# Patient Record
Sex: Female | Born: 1935
Health system: Southern US, Community
[De-identification: ages and names within clinical notes are randomized; demographics above are authoritative.]

## PROBLEM LIST (undated history)

## (undated) DIAGNOSIS — I10 Essential (primary) hypertension: Secondary | ICD-10-CM

## (undated) DIAGNOSIS — C801 Malignant (primary) neoplasm, unspecified: Secondary | ICD-10-CM

## (undated) DIAGNOSIS — F419 Anxiety disorder, unspecified: Secondary | ICD-10-CM

## (undated) DIAGNOSIS — H269 Unspecified cataract: Secondary | ICD-10-CM

## (undated) DIAGNOSIS — M810 Age-related osteoporosis without current pathological fracture: Secondary | ICD-10-CM

## (undated) DIAGNOSIS — T7840XA Allergy, unspecified, initial encounter: Secondary | ICD-10-CM

## (undated) DIAGNOSIS — E079 Disorder of thyroid, unspecified: Secondary | ICD-10-CM

## (undated) HISTORY — DX: Unspecified cataract: H26.9

## (undated) HISTORY — DX: Disorder of thyroid, unspecified: E07.9

## (undated) HISTORY — PX: TONSILLECTOMY: SUR1361

## (undated) HISTORY — PX: SPINE SURGERY: SHX786

## (undated) HISTORY — PX: EYE MUSCLE SURGERY: SHX370

## (undated) HISTORY — DX: Malignant (primary) neoplasm, unspecified: C80.1

## (undated) HISTORY — PX: TUBAL LIGATION: SHX77

## (undated) HISTORY — PX: BACK SURGERY: SHX140

## (undated) HISTORY — DX: Allergy, unspecified, initial encounter: T78.40XA

---

## 2005-01-13 ENCOUNTER — Encounter: Payer: Self-pay | Admitting: Family Medicine

## 2006-12-06 ENCOUNTER — Encounter: Payer: Self-pay | Admitting: Family Medicine

## 2012-02-14 DIAGNOSIS — E042 Nontoxic multinodular goiter: Secondary | ICD-10-CM | POA: Insufficient documentation

## 2014-04-17 ENCOUNTER — Ambulatory Visit: Payer: Self-pay | Admitting: Family Medicine

## 2014-04-21 ENCOUNTER — Other Ambulatory Visit (HOSPITAL_COMMUNITY): Payer: Self-pay | Admitting: Family Medicine

## 2014-04-21 DIAGNOSIS — T148XXA Other injury of unspecified body region, initial encounter: Secondary | ICD-10-CM

## 2014-04-22 ENCOUNTER — Ambulatory Visit (HOSPITAL_COMMUNITY)
Admission: RE | Admit: 2014-04-22 | Discharge: 2014-04-22 | Disposition: A | Discharge status: Home / Self Care | Payer: Medicare HMO | Source: Ambulatory Visit | Attending: Family Medicine | Admitting: Family Medicine

## 2014-04-22 DIAGNOSIS — M549 Dorsalgia, unspecified: Secondary | ICD-10-CM | POA: Diagnosis present

## 2014-04-22 DIAGNOSIS — M40204 Unspecified kyphosis, thoracic region: Secondary | ICD-10-CM | POA: Diagnosis not present

## 2014-04-22 DIAGNOSIS — M4324 Fusion of spine, thoracic region: Secondary | ICD-10-CM | POA: Insufficient documentation

## 2014-04-22 DIAGNOSIS — E042 Nontoxic multinodular goiter: Secondary | ICD-10-CM | POA: Insufficient documentation

## 2014-04-22 DIAGNOSIS — Z87311 Personal history of (healed) other pathological fracture: Secondary | ICD-10-CM | POA: Diagnosis not present

## 2014-04-22 DIAGNOSIS — T148XXA Other injury of unspecified body region, initial encounter: Secondary | ICD-10-CM

## 2015-01-07 ENCOUNTER — Other Ambulatory Visit: Payer: Self-pay | Admitting: Family Medicine

## 2015-01-07 NOTE — Telephone Encounter (Signed)
Last OV 09/18/2014  Thanks,   -Mickel Baas

## 2015-02-23 ENCOUNTER — Other Ambulatory Visit: Payer: Self-pay | Admitting: Internal Medicine

## 2015-02-23 ENCOUNTER — Other Ambulatory Visit: Payer: Self-pay | Admitting: Family Medicine

## 2015-02-23 DIAGNOSIS — E042 Nontoxic multinodular goiter: Secondary | ICD-10-CM

## 2015-02-23 DIAGNOSIS — I10 Essential (primary) hypertension: Secondary | ICD-10-CM

## 2015-02-23 NOTE — Telephone Encounter (Signed)
Last OV 08/2014  Thanks,   -Laura  

## 2015-03-24 ENCOUNTER — Other Ambulatory Visit: Payer: Self-pay | Admitting: Internal Medicine

## 2015-03-24 DIAGNOSIS — E042 Nontoxic multinodular goiter: Secondary | ICD-10-CM

## 2015-03-25 ENCOUNTER — Ambulatory Visit
Admission: RE | Admit: 2015-03-25 | Discharge: 2015-03-25 | Disposition: A | Discharge status: Home / Self Care | Payer: Medicare HMO | Source: Ambulatory Visit | Attending: Internal Medicine | Admitting: Internal Medicine

## 2015-03-25 ENCOUNTER — Telehealth: Payer: Self-pay

## 2015-03-25 DIAGNOSIS — E042 Nontoxic multinodular goiter: Secondary | ICD-10-CM

## 2015-03-25 NOTE — Telephone Encounter (Signed)
Patient is requesting results from U/S and CT scan from 03/25/2015 ordered by Dr. Quay Burow. Results are in chart.

## 2015-03-26 ENCOUNTER — Other Ambulatory Visit: Payer: Self-pay | Admitting: Internal Medicine

## 2015-03-26 DIAGNOSIS — E042 Nontoxic multinodular goiter: Secondary | ICD-10-CM

## 2015-03-27 NOTE — Telephone Encounter (Signed)
Pt is calling to get results.  CB#[phone]/MW

## 2015-03-27 NOTE — Telephone Encounter (Signed)
Talked with patient.  Will explore non-surgical options and then consider surgery if needed.  Thanks.

## 2015-04-15 ENCOUNTER — Other Ambulatory Visit: Payer: Self-pay | Admitting: Family Medicine

## 2015-04-15 DIAGNOSIS — I10 Essential (primary) hypertension: Secondary | ICD-10-CM

## 2015-04-15 NOTE — Telephone Encounter (Signed)
Last OV 08/2014  Thanks,   -Nuh Lipton  

## 2015-07-09 ENCOUNTER — Other Ambulatory Visit: Payer: Self-pay | Admitting: Family Medicine

## 2015-07-09 DIAGNOSIS — G8929 Other chronic pain: Secondary | ICD-10-CM | POA: Insufficient documentation

## 2015-07-09 DIAGNOSIS — M549 Dorsalgia, unspecified: Principal | ICD-10-CM

## 2015-07-09 NOTE — Telephone Encounter (Signed)
Printed, please fax or call in to pharmacy. Thank you.   

## 2015-07-31 ENCOUNTER — Other Ambulatory Visit: Payer: Self-pay | Admitting: Family Medicine

## 2015-07-31 DIAGNOSIS — J3089 Other allergic rhinitis: Secondary | ICD-10-CM

## 2015-08-03 DIAGNOSIS — J309 Allergic rhinitis, unspecified: Secondary | ICD-10-CM | POA: Insufficient documentation

## 2015-08-25 ENCOUNTER — Other Ambulatory Visit: Payer: Self-pay | Admitting: Family Medicine

## 2015-08-25 DIAGNOSIS — F419 Anxiety disorder, unspecified: Secondary | ICD-10-CM | POA: Insufficient documentation

## 2015-08-25 MED ORDER — LORAZEPAM 0.5 MG PO TABS
0.5000 mg | ORAL_TABLET | Freq: Two times a day (BID) | ORAL | Status: DC
Start: 2015-08-25 — End: 2019-11-15

## 2015-08-25 NOTE — Telephone Encounter (Signed)
Pt needs a prescription of lorazepam 0.5 mg.  She uses [facility] [address]  She takes the lorazepam when she gets anxious to keep her blood pressure down.  Her call back  [phone]  Thanks Con Memos

## 2015-08-25 NOTE — Telephone Encounter (Signed)
OK to call in lorazepam 

## 2015-08-25 NOTE — Telephone Encounter (Signed)
LOV 09/18/2014. Last refilled 05/27/2013. Would you agree to prescribe this? Renaldo Fiddler, CMA

## 2015-09-03 DIAGNOSIS — H353113 Nonexudative age-related macular degeneration, right eye, advanced atrophic without subfoveal involvement: Secondary | ICD-10-CM | POA: Diagnosis not present

## 2015-09-03 DIAGNOSIS — H52222 Regular astigmatism, left eye: Secondary | ICD-10-CM | POA: Diagnosis not present

## 2015-09-03 DIAGNOSIS — H353121 Nonexudative age-related macular degeneration, left eye, early dry stage: Secondary | ICD-10-CM | POA: Diagnosis not present

## 2015-09-03 DIAGNOSIS — H5203 Hypermetropia, bilateral: Secondary | ICD-10-CM | POA: Diagnosis not present

## 2015-09-03 DIAGNOSIS — H59812 Chorioretinal scars after surgery for detachment, left eye: Secondary | ICD-10-CM | POA: Diagnosis not present

## 2015-09-03 DIAGNOSIS — H2513 Age-related nuclear cataract, bilateral: Secondary | ICD-10-CM | POA: Diagnosis not present

## 2015-09-03 DIAGNOSIS — I10 Essential (primary) hypertension: Secondary | ICD-10-CM | POA: Diagnosis not present

## 2015-09-04 ENCOUNTER — Other Ambulatory Visit: Payer: Self-pay | Admitting: Family Medicine

## 2015-10-02 DIAGNOSIS — H6063 Unspecified chronic otitis externa, bilateral: Secondary | ICD-10-CM | POA: Diagnosis not present

## 2015-10-02 DIAGNOSIS — H903 Sensorineural hearing loss, bilateral: Secondary | ICD-10-CM | POA: Diagnosis not present

## 2015-11-09 ENCOUNTER — Other Ambulatory Visit: Payer: Self-pay | Admitting: Family Medicine

## 2015-11-09 DIAGNOSIS — M549 Dorsalgia, unspecified: Principal | ICD-10-CM

## 2015-11-09 DIAGNOSIS — G8929 Other chronic pain: Secondary | ICD-10-CM

## 2015-11-09 NOTE — Telephone Encounter (Signed)
09/18/14 last ov

## 2015-11-11 ENCOUNTER — Telehealth: Payer: Self-pay | Admitting: Family Medicine

## 2015-11-11 DIAGNOSIS — G8929 Other chronic pain: Secondary | ICD-10-CM

## 2015-11-11 DIAGNOSIS — M549 Dorsalgia, unspecified: Principal | ICD-10-CM

## 2015-11-11 MED ORDER — ACETAMINOPHEN-CODEINE #3 300-30 MG PO TABS: 1.0000 | ORAL_TABLET | ORAL | Status: DC | PRN

## 2015-11-11 NOTE — Telephone Encounter (Signed)
Pt needs refill acetaminophen-codeine (TYLENOL #3) 300-30 MG tablet  Patient call back is  [phone]  Thank sTeri

## 2015-11-11 NOTE — Telephone Encounter (Signed)
Reprinted. Thanks.  

## 2015-11-11 NOTE — Telephone Encounter (Signed)
According to the chart, this was printed out 11/09/2015 by you, but I can't find the script. Was not logged into book. Do you recall printing this? Renaldo Fiddler, CMA

## 2015-11-11 NOTE — Telephone Encounter (Signed)
Informed pt is ready for pick up. Renaldo Fiddler, CMA

## 2016-01-12 DIAGNOSIS — M545 Low back pain: Secondary | ICD-10-CM | POA: Diagnosis not present

## 2016-01-12 DIAGNOSIS — M810 Age-related osteoporosis without current pathological fracture: Secondary | ICD-10-CM | POA: Diagnosis not present

## 2016-01-12 DIAGNOSIS — Z79899 Other long term (current) drug therapy: Secondary | ICD-10-CM | POA: Diagnosis not present

## 2016-01-12 DIAGNOSIS — G8929 Other chronic pain: Secondary | ICD-10-CM | POA: Diagnosis not present

## 2016-01-12 DIAGNOSIS — F419 Anxiety disorder, unspecified: Secondary | ICD-10-CM | POA: Diagnosis not present

## 2016-01-12 DIAGNOSIS — I10 Essential (primary) hypertension: Secondary | ICD-10-CM | POA: Diagnosis not present

## 2016-01-13 DIAGNOSIS — Z79899 Other long term (current) drug therapy: Secondary | ICD-10-CM | POA: Diagnosis not present

## 2016-01-13 DIAGNOSIS — M810 Age-related osteoporosis without current pathological fracture: Secondary | ICD-10-CM | POA: Diagnosis not present

## 2016-01-13 DIAGNOSIS — I10 Essential (primary) hypertension: Secondary | ICD-10-CM | POA: Diagnosis not present

## 2016-02-03 ENCOUNTER — Other Ambulatory Visit: Payer: Self-pay | Admitting: Physician Assistant

## 2016-02-03 DIAGNOSIS — I10 Essential (primary) hypertension: Secondary | ICD-10-CM

## 2016-02-03 MED ORDER — ATENOLOL 50 MG PO TABS: 50.0000 mg | ORAL_TABLET | Freq: Every day | ORAL | 0 refills | Status: DC

## 2016-02-03 NOTE — Telephone Encounter (Signed)
Pt contacted office for refill request on the following medications: atenolol (TENORMIN) 50 MG tablet To [facility] [address] This was a pt of Dr. Venia Minks. I advised pt that she hasn't been seen in the office in over a year and that I would schedule her an OV. Pt requested that I ask for the refill b/c she isn't sure if she is going to find another PCP. Please advise. Thanks TNP

## 2016-02-03 NOTE — Telephone Encounter (Signed)
Is this okay? Katie Hanna, CMA

## 2016-03-10 DIAGNOSIS — H353122 Nonexudative age-related macular degeneration, left eye, intermediate dry stage: Secondary | ICD-10-CM | POA: Diagnosis not present

## 2016-03-10 DIAGNOSIS — I10 Essential (primary) hypertension: Secondary | ICD-10-CM | POA: Diagnosis not present

## 2016-03-10 DIAGNOSIS — H2513 Age-related nuclear cataract, bilateral: Secondary | ICD-10-CM | POA: Diagnosis not present

## 2016-03-10 DIAGNOSIS — H5203 Hypermetropia, bilateral: Secondary | ICD-10-CM | POA: Diagnosis not present

## 2016-03-10 DIAGNOSIS — H353113 Nonexudative age-related macular degeneration, right eye, advanced atrophic without subfoveal involvement: Secondary | ICD-10-CM | POA: Diagnosis not present

## 2016-03-10 DIAGNOSIS — H52223 Regular astigmatism, bilateral: Secondary | ICD-10-CM | POA: Diagnosis not present

## 2016-03-10 DIAGNOSIS — H59812 Chorioretinal scars after surgery for detachment, left eye: Secondary | ICD-10-CM | POA: Diagnosis not present

## 2016-03-16 DIAGNOSIS — Z23 Encounter for immunization: Secondary | ICD-10-CM | POA: Diagnosis not present

## 2016-03-31 DIAGNOSIS — M810 Age-related osteoporosis without current pathological fracture: Secondary | ICD-10-CM | POA: Diagnosis not present

## 2016-04-07 DIAGNOSIS — E042 Nontoxic multinodular goiter: Secondary | ICD-10-CM | POA: Diagnosis not present

## 2016-04-07 DIAGNOSIS — I10 Essential (primary) hypertension: Secondary | ICD-10-CM | POA: Diagnosis not present

## 2016-04-07 DIAGNOSIS — M810 Age-related osteoporosis without current pathological fracture: Secondary | ICD-10-CM | POA: Diagnosis not present

## 2016-04-15 DIAGNOSIS — H52223 Regular astigmatism, bilateral: Secondary | ICD-10-CM | POA: Diagnosis not present

## 2016-04-15 DIAGNOSIS — H5203 Hypermetropia, bilateral: Secondary | ICD-10-CM | POA: Diagnosis not present

## 2016-04-15 DIAGNOSIS — H10013 Acute follicular conjunctivitis, bilateral: Secondary | ICD-10-CM | POA: Diagnosis not present

## 2016-04-15 DIAGNOSIS — H353113 Nonexudative age-related macular degeneration, right eye, advanced atrophic without subfoveal involvement: Secondary | ICD-10-CM | POA: Diagnosis not present

## 2016-04-15 DIAGNOSIS — I10 Essential (primary) hypertension: Secondary | ICD-10-CM | POA: Diagnosis not present

## 2016-04-15 DIAGNOSIS — H2513 Age-related nuclear cataract, bilateral: Secondary | ICD-10-CM | POA: Diagnosis not present

## 2016-04-15 DIAGNOSIS — H353122 Nonexudative age-related macular degeneration, left eye, intermediate dry stage: Secondary | ICD-10-CM | POA: Diagnosis not present

## 2016-04-15 DIAGNOSIS — H59812 Chorioretinal scars after surgery for detachment, left eye: Secondary | ICD-10-CM | POA: Diagnosis not present

## 2016-04-28 ENCOUNTER — Other Ambulatory Visit: Payer: Self-pay | Admitting: *Deleted

## 2016-04-28 DIAGNOSIS — I10 Essential (primary) hypertension: Secondary | ICD-10-CM

## 2016-04-28 NOTE — Telephone Encounter (Signed)
Patient does not have an ov scheduled.

## 2016-05-01 MED ORDER — HYDROCHLOROTHIAZIDE 25 MG PO TABS: 25.0000 mg | ORAL_TABLET | Freq: Every day | ORAL | 0 refills | Status: DC

## 2016-05-20 DIAGNOSIS — L251 Unspecified contact dermatitis due to drugs in contact with skin: Secondary | ICD-10-CM | POA: Diagnosis not present

## 2016-06-14 DIAGNOSIS — J0100 Acute maxillary sinusitis, unspecified: Secondary | ICD-10-CM | POA: Diagnosis not present

## 2016-09-13 DIAGNOSIS — H10013 Acute follicular conjunctivitis, bilateral: Secondary | ICD-10-CM | POA: Diagnosis not present

## 2016-09-13 DIAGNOSIS — H52223 Regular astigmatism, bilateral: Secondary | ICD-10-CM | POA: Diagnosis not present

## 2016-09-13 DIAGNOSIS — H353122 Nonexudative age-related macular degeneration, left eye, intermediate dry stage: Secondary | ICD-10-CM | POA: Diagnosis not present

## 2016-09-13 DIAGNOSIS — H2513 Age-related nuclear cataract, bilateral: Secondary | ICD-10-CM | POA: Diagnosis not present

## 2016-09-13 DIAGNOSIS — I10 Essential (primary) hypertension: Secondary | ICD-10-CM | POA: Diagnosis not present

## 2016-09-13 DIAGNOSIS — H353113 Nonexudative age-related macular degeneration, right eye, advanced atrophic without subfoveal involvement: Secondary | ICD-10-CM | POA: Diagnosis not present

## 2016-09-13 DIAGNOSIS — H5203 Hypermetropia, bilateral: Secondary | ICD-10-CM | POA: Diagnosis not present

## 2016-09-13 DIAGNOSIS — H59812 Chorioretinal scars after surgery for detachment, left eye: Secondary | ICD-10-CM | POA: Diagnosis not present

## 2016-09-15 DIAGNOSIS — K047 Periapical abscess without sinus: Secondary | ICD-10-CM | POA: Diagnosis not present

## 2016-10-21 DIAGNOSIS — I10 Essential (primary) hypertension: Secondary | ICD-10-CM | POA: Diagnosis not present

## 2016-10-21 DIAGNOSIS — E7800 Pure hypercholesterolemia, unspecified: Secondary | ICD-10-CM | POA: Diagnosis not present

## 2016-10-21 DIAGNOSIS — Z0000 Encounter for general adult medical examination without abnormal findings: Secondary | ICD-10-CM | POA: Diagnosis not present

## 2016-10-21 DIAGNOSIS — M810 Age-related osteoporosis without current pathological fracture: Secondary | ICD-10-CM | POA: Diagnosis not present

## 2016-10-21 DIAGNOSIS — Z79899 Other long term (current) drug therapy: Secondary | ICD-10-CM | POA: Diagnosis not present

## 2016-10-31 ENCOUNTER — Other Ambulatory Visit: Payer: Self-pay | Admitting: Family Medicine

## 2016-10-31 DIAGNOSIS — I10 Essential (primary) hypertension: Secondary | ICD-10-CM

## 2016-11-22 DIAGNOSIS — M810 Age-related osteoporosis without current pathological fracture: Secondary | ICD-10-CM | POA: Diagnosis not present

## 2017-01-20 DIAGNOSIS — E042 Nontoxic multinodular goiter: Secondary | ICD-10-CM | POA: Diagnosis not present

## 2017-01-20 DIAGNOSIS — M4850XS Collapsed vertebra, not elsewhere classified, site unspecified, sequela of fracture: Secondary | ICD-10-CM | POA: Diagnosis not present

## 2017-01-20 DIAGNOSIS — M810 Age-related osteoporosis without current pathological fracture: Secondary | ICD-10-CM | POA: Diagnosis not present

## 2017-02-01 DIAGNOSIS — H6122 Impacted cerumen, left ear: Secondary | ICD-10-CM | POA: Diagnosis not present

## 2017-02-01 DIAGNOSIS — H903 Sensorineural hearing loss, bilateral: Secondary | ICD-10-CM | POA: Diagnosis not present

## 2017-02-24 DIAGNOSIS — H5203 Hypermetropia, bilateral: Secondary | ICD-10-CM | POA: Diagnosis not present

## 2017-02-24 DIAGNOSIS — H353122 Nonexudative age-related macular degeneration, left eye, intermediate dry stage: Secondary | ICD-10-CM | POA: Diagnosis not present

## 2017-02-24 DIAGNOSIS — H2513 Age-related nuclear cataract, bilateral: Secondary | ICD-10-CM | POA: Diagnosis not present

## 2017-02-24 DIAGNOSIS — I10 Essential (primary) hypertension: Secondary | ICD-10-CM | POA: Diagnosis not present

## 2017-02-24 DIAGNOSIS — H52223 Regular astigmatism, bilateral: Secondary | ICD-10-CM | POA: Diagnosis not present

## 2017-02-24 DIAGNOSIS — H1089 Other conjunctivitis: Secondary | ICD-10-CM | POA: Diagnosis not present

## 2017-02-24 DIAGNOSIS — H59812 Chorioretinal scars after surgery for detachment, left eye: Secondary | ICD-10-CM | POA: Diagnosis not present

## 2017-02-24 DIAGNOSIS — H10013 Acute follicular conjunctivitis, bilateral: Secondary | ICD-10-CM | POA: Diagnosis not present

## 2017-02-24 DIAGNOSIS — H353113 Nonexudative age-related macular degeneration, right eye, advanced atrophic without subfoveal involvement: Secondary | ICD-10-CM | POA: Diagnosis not present

## 2017-03-08 DIAGNOSIS — Z23 Encounter for immunization: Secondary | ICD-10-CM | POA: Diagnosis not present

## 2017-03-14 DIAGNOSIS — H5203 Hypermetropia, bilateral: Secondary | ICD-10-CM | POA: Diagnosis not present

## 2017-03-14 DIAGNOSIS — H1089 Other conjunctivitis: Secondary | ICD-10-CM | POA: Diagnosis not present

## 2017-03-14 DIAGNOSIS — I10 Essential (primary) hypertension: Secondary | ICD-10-CM | POA: Diagnosis not present

## 2017-03-14 DIAGNOSIS — H353113 Nonexudative age-related macular degeneration, right eye, advanced atrophic without subfoveal involvement: Secondary | ICD-10-CM | POA: Diagnosis not present

## 2017-03-14 DIAGNOSIS — H59812 Chorioretinal scars after surgery for detachment, left eye: Secondary | ICD-10-CM | POA: Diagnosis not present

## 2017-03-14 DIAGNOSIS — H2513 Age-related nuclear cataract, bilateral: Secondary | ICD-10-CM | POA: Diagnosis not present

## 2017-03-14 DIAGNOSIS — H353122 Nonexudative age-related macular degeneration, left eye, intermediate dry stage: Secondary | ICD-10-CM | POA: Diagnosis not present

## 2017-03-14 DIAGNOSIS — H52223 Regular astigmatism, bilateral: Secondary | ICD-10-CM | POA: Diagnosis not present

## 2017-03-14 DIAGNOSIS — H10013 Acute follicular conjunctivitis, bilateral: Secondary | ICD-10-CM | POA: Diagnosis not present

## 2017-04-06 DIAGNOSIS — M79675 Pain in left toe(s): Secondary | ICD-10-CM | POA: Diagnosis not present

## 2017-04-06 DIAGNOSIS — B351 Tinea unguium: Secondary | ICD-10-CM | POA: Diagnosis not present

## 2017-04-06 DIAGNOSIS — L03031 Cellulitis of right toe: Secondary | ICD-10-CM | POA: Diagnosis not present

## 2017-04-06 DIAGNOSIS — M79674 Pain in right toe(s): Secondary | ICD-10-CM | POA: Diagnosis not present

## 2017-04-24 DIAGNOSIS — F418 Other specified anxiety disorders: Secondary | ICD-10-CM | POA: Diagnosis not present

## 2017-04-24 DIAGNOSIS — E7800 Pure hypercholesterolemia, unspecified: Secondary | ICD-10-CM | POA: Diagnosis not present

## 2017-04-24 DIAGNOSIS — I10 Essential (primary) hypertension: Secondary | ICD-10-CM | POA: Diagnosis not present

## 2017-04-24 DIAGNOSIS — M810 Age-related osteoporosis without current pathological fracture: Secondary | ICD-10-CM | POA: Diagnosis not present

## 2017-04-24 DIAGNOSIS — Z79899 Other long term (current) drug therapy: Secondary | ICD-10-CM | POA: Diagnosis not present

## 2017-04-25 DIAGNOSIS — L03031 Cellulitis of right toe: Secondary | ICD-10-CM | POA: Diagnosis not present

## 2017-05-25 DIAGNOSIS — L03031 Cellulitis of right toe: Secondary | ICD-10-CM | POA: Diagnosis not present

## 2017-09-19 DIAGNOSIS — H04123 Dry eye syndrome of bilateral lacrimal glands: Secondary | ICD-10-CM | POA: Diagnosis not present

## 2017-09-19 DIAGNOSIS — H353113 Nonexudative age-related macular degeneration, right eye, advanced atrophic without subfoveal involvement: Secondary | ICD-10-CM | POA: Diagnosis not present

## 2017-09-19 DIAGNOSIS — H2511 Age-related nuclear cataract, right eye: Secondary | ICD-10-CM | POA: Diagnosis not present

## 2017-09-19 DIAGNOSIS — H2512 Age-related nuclear cataract, left eye: Secondary | ICD-10-CM | POA: Diagnosis not present

## 2017-09-19 DIAGNOSIS — I10 Essential (primary) hypertension: Secondary | ICD-10-CM | POA: Diagnosis not present

## 2017-09-19 DIAGNOSIS — H1089 Other conjunctivitis: Secondary | ICD-10-CM | POA: Diagnosis not present

## 2017-09-19 DIAGNOSIS — H353122 Nonexudative age-related macular degeneration, left eye, intermediate dry stage: Secondary | ICD-10-CM | POA: Diagnosis not present

## 2017-09-19 DIAGNOSIS — H10013 Acute follicular conjunctivitis, bilateral: Secondary | ICD-10-CM | POA: Diagnosis not present

## 2017-09-19 DIAGNOSIS — H59812 Chorioretinal scars after surgery for detachment, left eye: Secondary | ICD-10-CM | POA: Diagnosis not present

## 2017-09-19 DIAGNOSIS — H52222 Regular astigmatism, left eye: Secondary | ICD-10-CM | POA: Diagnosis not present

## 2017-09-19 DIAGNOSIS — H5203 Hypermetropia, bilateral: Secondary | ICD-10-CM | POA: Diagnosis not present

## 2017-10-02 DIAGNOSIS — S40861A Insect bite (nonvenomous) of right upper arm, initial encounter: Secondary | ICD-10-CM | POA: Diagnosis not present

## 2017-10-11 DIAGNOSIS — J309 Allergic rhinitis, unspecified: Secondary | ICD-10-CM | POA: Diagnosis not present

## 2017-10-13 DIAGNOSIS — R05 Cough: Secondary | ICD-10-CM | POA: Diagnosis not present

## 2017-10-13 DIAGNOSIS — J069 Acute upper respiratory infection, unspecified: Secondary | ICD-10-CM | POA: Diagnosis not present

## 2017-10-17 DIAGNOSIS — E7800 Pure hypercholesterolemia, unspecified: Secondary | ICD-10-CM | POA: Diagnosis not present

## 2017-10-17 DIAGNOSIS — Z79899 Other long term (current) drug therapy: Secondary | ICD-10-CM | POA: Diagnosis not present

## 2017-10-17 DIAGNOSIS — M810 Age-related osteoporosis without current pathological fracture: Secondary | ICD-10-CM | POA: Diagnosis not present

## 2017-10-27 DIAGNOSIS — Z0000 Encounter for general adult medical examination without abnormal findings: Secondary | ICD-10-CM | POA: Diagnosis not present

## 2017-12-07 ENCOUNTER — Emergency Department: Payer: PPO

## 2017-12-07 ENCOUNTER — Other Ambulatory Visit: Payer: Self-pay

## 2017-12-07 ENCOUNTER — Emergency Department
Admission: EM | Admit: 2017-12-07 | Discharge: 2017-12-07 | Disposition: A | Discharge status: Home / Self Care | Payer: PPO | Attending: Emergency Medicine | Admitting: Emergency Medicine

## 2017-12-07 DIAGNOSIS — K047 Periapical abscess without sinus: Secondary | ICD-10-CM | POA: Diagnosis not present

## 2017-12-07 DIAGNOSIS — I10 Essential (primary) hypertension: Secondary | ICD-10-CM | POA: Diagnosis not present

## 2017-12-07 DIAGNOSIS — M79652 Pain in left thigh: Secondary | ICD-10-CM | POA: Diagnosis not present

## 2017-12-07 DIAGNOSIS — Z79899 Other long term (current) drug therapy: Secondary | ICD-10-CM | POA: Diagnosis not present

## 2017-12-07 DIAGNOSIS — M25562 Pain in left knee: Secondary | ICD-10-CM | POA: Diagnosis not present

## 2017-12-07 DIAGNOSIS — M79605 Pain in left leg: Secondary | ICD-10-CM | POA: Diagnosis not present

## 2017-12-07 HISTORY — DX: Age-related osteoporosis without current pathological fracture: M81.0

## 2017-12-07 MED ORDER — LIDOCAINE 5 % EX PTCH
MEDICATED_PATCH | CUTANEOUS | Status: AC
  Administered 2017-12-07: 1 via TRANSDERMAL
  Filled 2017-12-07: qty 1

## 2017-12-07 MED ORDER — LIDOCAINE 5 % EX PTCH
1.0000 | MEDICATED_PATCH | CUTANEOUS | Status: DC
  Administered 2017-12-07: 1 via TRANSDERMAL

## 2017-12-07 MED ORDER — LIDOCAINE HCL 4 % EX SOLN: CUTANEOUS | 0 refills | Status: DC | PRN

## 2017-12-07 NOTE — ED Provider Notes (Signed)
[facility] Emergency Department Provider Note  ____________________________________________   I have reviewed the triage vital signs and the nursing notes.   HISTORY  Chief Complaint Leg Pain   History limited by: Not Limited   HPI Katie Hanna is a 84 y.o. female who presents to the emergency department today because of concerns for leg pain.  Is located in the left leg.  The pain is worse just lateral and superior to the left knee.  She states that the pain started today.  She was getting ready for an appointment when the pain came.  She describes it as a surface pain.  It is severe.  Since the initial episode she has had a few more episodes.  At this point she states that she is simply tender to palpation of her skin.  She denies any trauma.  She does have a history of significant osteoporosis.  Went to walk-in clinic where they had concerns for blood clot.   Per medical record review patient has a history of osteoporosis.   Past Medical History:  Diagnosis Date  . Osteoporosis     Patient Active Problem List   Diagnosis Date Noted  . Acute anxiety 08/25/2015  . Rhinitis, allergic 08/03/2015  . Back pain, chronic 07/09/2015  . Essential (primary) hypertension 02/23/2015    Past Surgical History:  Procedure Laterality Date  . BACK SURGERY    . EYE MUSCLE SURGERY    . TONSILLECTOMY    . TUBAL LIGATION      Prior to Admission medications   Medication Sig Start Date End Date Taking? Authorizing Provider  acetaminophen-codeine (TYLENOL #3) 300-30 MG tablet Take 1 tablet by mouth every 4 (four) hours as needed for moderate pain. 11/11/15   Margarita Rana, MD  atenolol (TENORMIN) 50 MG tablet Take 1 tablet (50 mg total) by mouth daily. Pt needs to FU with new provider before more refills. Thanks. 02/03/16   Birdie Sons, MD  fluticasone Asencion Islam) 50 MCG/ACT nasal spray INSTILL 2 SPRAYS INTO EACH NOSTRIL EVERY DAY 08/03/15   Margarita Rana, MD   hydrochlorothiazide (HYDRODIURIL) 25 MG tablet Take 1 tablet (25 mg total) by mouth daily. 05/01/16   Birdie Sons, MD  LORazepam (ATIVAN) 0.5 MG tablet Take 1-2 tablets (0.5-1 mg total) by mouth 2 (two) times daily. 08/25/15   Birdie Sons, MD    Allergies Omnipaque [iohexol]; Alendronate; and Hydrocodone-acetaminophen  History reviewed. No pertinent family history.  Social History Social History   Tobacco Use  . Smoking status: Never Smoker  Substance Use Topics  . Alcohol use: Never    Frequency: Never  . Drug use: Not on file    Review of Systems Constitutional: No fever/chills Eyes: No visual changes. ENT: No sore throat. Cardiovascular: Denies chest pain. Respiratory: Denies shortness of breath. Gastrointestinal: No abdominal pain.  No nausea, no vomiting.  No diarrhea.   Genitourinary: Negative for dysuria. Musculoskeletal: Positive for left leg pain. Skin: Negative for rash. Neurological: Negative for headaches, focal weakness or numbness.  ____________________________________________   PHYSICAL EXAM:  VITAL SIGNS: ED Triage Vitals  Enc Vitals Group     BP 12/07/17 1723 (!) 162/101     Pulse Rate 12/07/17 1723 72     Resp 12/07/17 1723 18     Temp 12/07/17 1723 98.1 F (36.7 C)     Temp Source 12/07/17 1723 Oral     SpO2 12/07/17 1723 100 %     Weight 12/07/17 1724 106  lb (48.1 kg)     Height 12/07/17 1724 4\' 10"  (1.473 m)     Head Circumference --      Peak Flow --      Pain Score 12/07/17 1723 8   Constitutional: Alert and oriented.  Eyes: Conjunctivae are normal.  ENT      Head: Normocephalic and atraumatic.      Nose: No congestion/rhinnorhea.      Mouth/Throat: Mucous membranes are moist.      Neck: No stridor. Hematological/Lymphatic/Immunilogical: No cervical lymphadenopathy. Cardiovascular: Normal rate, regular rhythm.  No murmurs, rubs, or gallops.  Respiratory: Normal respiratory effort without tachypnea nor retractions. Breath  sounds are clear and equal bilaterally. No wheezes/rales/rhonchi. Gastrointestinal: Soft and non tender. No rebound. No guarding.  Genitourinary: Deferred Musculoskeletal: Normal range of motion in all extremities. No lower extremity edema. Slight tenderness to palpation of the left lateral distal thigh.  Neurologic:  Normal speech and language. No gross focal neurologic deficits are appreciated.  Skin:  Skin is warm, dry and intact. No rash noted. Psychiatric: Mood and affect are normal. Speech and behavior are normal. Patient exhibits appropriate insight and judgment.  ____________________________________________    LABS (pertinent positives/negatives)  None  ____________________________________________   EKG  None  ____________________________________________    RADIOLOGY  US venous left lower extremity No dvt  Left femur No acute findings ____________________________________________   PROCEDURES  Procedures  ____________________________________________   INITIAL IMPRESSION / ASSESSMENT AND PLAN / ED COURSE  Pertinent labs & imaging results that were available during my care of the patient were reviewed by me and considered in my medical decision making (see chart for details).   Patient presented to the emergency department today because of left leg pain.  Both ultrasound and x-ray were negative.  At this point unclear etiology of the localized pain.  Did discuss possibility of early shingles.  Will discharge with lidocaine.   ____________________________________________   FINAL CLINICAL IMPRESSION(S) / ED DIAGNOSES  Final diagnoses:  Left leg pain     Note: This dictation was prepared with Dragon dictation. Any transcriptional errors that result from this process are unintentional     Nance Pear, MD 12/07/17 2021

## 2017-12-07 NOTE — ED Notes (Signed)
Verbal order at this time for [place] of L leg, no other orders at this time.

## 2017-12-07 NOTE — Discharge Instructions (Addendum)
Please seek medical attention for any high fevers, chest pain, shortness of breath, change in behavior, persistent vomiting, bloody stool or any other new or concerning symptoms.  

## 2017-12-07 NOTE — ED Triage Notes (Addendum)
Pt states "severe leg pain since 1pm" states felt "a terrific sting close the surface." states pain hit her again while getting in car. States went to walk-in clinic, when mashed on calf and above knee had pain. No redness noted. No warmth noted. No swelling noted.   Alert, oriented, in wheelchair.

## 2018-01-19 DIAGNOSIS — F5105 Insomnia due to other mental disorder: Secondary | ICD-10-CM | POA: Diagnosis not present

## 2018-01-19 DIAGNOSIS — F419 Anxiety disorder, unspecified: Secondary | ICD-10-CM | POA: Diagnosis not present

## 2018-01-19 DIAGNOSIS — I10 Essential (primary) hypertension: Secondary | ICD-10-CM | POA: Diagnosis not present

## 2018-01-26 DIAGNOSIS — M810 Age-related osteoporosis without current pathological fracture: Secondary | ICD-10-CM | POA: Diagnosis not present

## 2018-01-26 DIAGNOSIS — E042 Nontoxic multinodular goiter: Secondary | ICD-10-CM | POA: Diagnosis not present

## 2018-01-26 DIAGNOSIS — Z79899 Other long term (current) drug therapy: Secondary | ICD-10-CM | POA: Diagnosis not present

## 2018-02-04 ENCOUNTER — Encounter: Payer: Self-pay | Admitting: Emergency Medicine

## 2018-02-04 ENCOUNTER — Other Ambulatory Visit: Payer: Self-pay

## 2018-02-04 ENCOUNTER — Emergency Department
Admission: EM | Admit: 2018-02-04 | Discharge: 2018-02-04 | Disposition: A | Discharge status: Home / Self Care | Payer: PPO | Attending: Emergency Medicine | Admitting: Emergency Medicine

## 2018-02-04 DIAGNOSIS — Z79899 Other long term (current) drug therapy: Secondary | ICD-10-CM | POA: Insufficient documentation

## 2018-02-04 DIAGNOSIS — F419 Anxiety disorder, unspecified: Secondary | ICD-10-CM | POA: Insufficient documentation

## 2018-02-04 DIAGNOSIS — I10 Essential (primary) hypertension: Secondary | ICD-10-CM

## 2018-02-04 HISTORY — DX: Anxiety disorder, unspecified: F41.9

## 2018-02-04 HISTORY — DX: Essential (primary) hypertension: I10

## 2018-02-04 LAB — BASIC METABOLIC PANEL
Anion gap: 7 (ref 5–15)
BUN: 13 mg/dL (ref 8–23)
CALCIUM: 9.6 mg/dL (ref 8.9–10.3)
CO2: 28 mmol/L (ref 22–32)
CREATININE: 0.48 mg/dL (ref 0.44–1.00)
Chloride: 94 mmol/L — ABNORMAL LOW (ref 98–111)
GFR calc non Af Amer: >60 mL/min (ref >60)
Glucose, Bld: 109 mg/dL — ABNORMAL HIGH (ref 70–99)
Potassium: 3.7 mmol/L (ref 3.5–5.1)
SODIUM: 129 mmol/L — AB (ref 135–145)

## 2018-02-04 LAB — CBC
HCT: 39.9 % (ref 35.0–47.0)
Hemoglobin: 14.1 g/dL (ref 12.0–16.0)
MCH: 32.1 pg (ref 26.0–34.0)
MCHC: 35.2 g/dL (ref 32.0–36.0)
MCV: 91.3 fL (ref 80.0–100.0)
PLATELETS: 173 10*3/uL (ref 150–440)
RBC: 4.37 MIL/uL (ref 3.80–5.20)
RDW: 13.4 % (ref 11.5–14.5)
WBC: 7.8 10*3/uL (ref 3.6–11.0)

## 2018-02-04 LAB — TROPONIN I: TROPONIN I: 0.04 ng/mL — AB (ref <0.03)

## 2018-02-04 NOTE — ED Triage Notes (Addendum)
Pt reports cooking lunch for her husband when she started seeing "colors." Pt describes seeing rainbows with her left eye. Sat down in bedroom and took a lorazepam. Pt reportedly has "an anxiety disorder."   Did not check blood pressure at that time, but did call 911, who reportedly determined pt was not having a stroke. 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  Hx hypertension. Takes 12.5mg  HCTZ and 25mg  atenolol (has been starting to cut the latter in half).

## 2018-02-04 NOTE — ED Provider Notes (Signed)
[facility] Emergency Department Provider Note   ____________________________________________    I have reviewed the triage vital signs and the nursing notes.   HISTORY  Chief Complaint Hypertension     HPI Katie Hanna is a 84 y.o. female who presents with complaints of high blood pressure and anxiety.  Patient reports that she was rushing to cook lunch for her husband, she is trying to cook and healthy foods after he had an episode of hypoglycemia recently and she felt herself falling behind and became anxious about this.  She reports she turned rapidly and saw bright colors in both of her eyes.  Which lasted only a few seconds.  Did not have any dizziness nausea diaphoresis chest pain or shortness of breath.  No weakness or numbness.  She reports she was very anxious and took a lorazepam which helped her symptoms tremendously.  She has a history of macular degeneration in the right eye and can only see light and movement.  She reports her left eye vision is normal no change   Past Medical History:  Diagnosis Date  . Anxiety   . Hypertension   . Osteoporosis     Patient Active Problem List   Diagnosis Date Noted  . Acute anxiety 08/25/2015  . Rhinitis, allergic 08/03/2015  . Back pain, chronic 07/09/2015  . Essential (primary) hypertension 02/23/2015    Past Surgical History:  Procedure Laterality Date  . BACK SURGERY    . EYE MUSCLE SURGERY    . TONSILLECTOMY    . TUBAL LIGATION      Prior to Admission medications   Medication Sig Start Date End Date Taking? Authorizing Provider  acetaminophen-codeine (TYLENOL #3) 300-30 MG tablet Take 1 tablet by mouth every 4 (four) hours as needed for moderate pain. Patient taking differently: Take 0.5 tablets by mouth every evening.  11/11/15   Margarita Rana, MD  atenolol (TENORMIN) 50 MG tablet Take 1 tablet (50 mg total) by mouth daily. Pt needs to FU with new provider before more refills.  Thanks. Patient taking differently: Take 25 mg by mouth daily.  02/03/16   Birdie Sons, MD  fluticasone Asencion Islam) 50 MCG/ACT nasal spray INSTILL 2 SPRAYS INTO EACH NOSTRIL EVERY DAY 08/03/15   Margarita Rana, MD  hydrochlorothiazide (HYDRODIURIL) 25 MG tablet Take 1 tablet (25 mg total) by mouth daily. Patient taking differently: Take 12.5 mg by mouth daily.  05/01/16   Birdie Sons, MD  lidocaine (XYLOCAINE) 4 % external solution Apply topically as needed. 12/07/17   Nance Pear, MD  LORazepam (ATIVAN) 0.5 MG tablet Take 1-2 tablets (0.5-1 mg total) by mouth 2 (two) times daily. 08/25/15   Birdie Sons, MD     Allergies Omnipaque [iohexol]; Alendronate; and Hydrocodone-acetaminophen  No family history on file.  Social History Social History   Tobacco Use  . Smoking status: Never Smoker  Substance Use Topics  . Alcohol use: Never    Frequency: Never  . Drug use: Not on file    Review of Systems  Constitutional: No dizziness Eyes: As above ENT: No neck pain Cardiovascular: Denies chest pain. Respiratory: Denies shortness of breath. Gastrointestinal: No abdominal pain.   Genitourinary: Negative for dysuria. Musculoskeletal: Negative for back pain. Skin: Negative for rash. Neurological: Negative for headaches    ____________________________________________   PHYSICAL EXAM:  VITAL SIGNS: ED Triage Vitals  Enc Vitals Group     BP 02/04/18 1356 (!) 152/82     Pulse  Rate 02/04/18 1356 87     Resp 02/04/18 1356 16     Temp 02/04/18 1356 98 F (36.7 C)     Temp Source 02/04/18 1356 Oral     SpO2 02/04/18 1356 99 %     Weight 02/04/18 1404 46.7 kg (103 lb)     Height 02/04/18 1404 1.473 m (4\' 10" )     Head Circumference --      Peak Flow --      Pain Score 02/04/18 1404 0     Pain Loc --      Pain Edu? --      Excl. in [place]? --     Constitutional: Alert and oriented.  Eyes: Conjunctivae are normal.  Left funduscopic exam unremarkable.  Vision  normal.  Nose: No congestion/rhinnorhea. Mouth/Throat: Mucous membranes are moist.    Cardiovascular: Normal rate, regular rhythm. Grossly normal heart sounds.  Good peripheral circulation. Respiratory: Normal respiratory effort.  No retractions. Lungs CTAB. Gastrointestinal: Soft and nontender. No distention.   Genitourinary: deferred Musculoskeletal: No lower extremity tenderness nor edema.  Warm and well perfused Neurologic:  Normal speech and language. No gross focal neurologic deficits are appreciated.  Skin:  Skin is warm, dry and intact. No rash noted. Psychiatric: Mood and affect are normal. Speech and behavior are normal.  ____________________________________________   LABS (all labs ordered are listed, but only abnormal results are displayed)  Labs Reviewed  BASIC METABOLIC PANEL - Abnormal; Notable for the following components:      Result Value   Sodium 129 (*)    Chloride 94 (*)    Glucose, Bld 109 (*)    All other components within normal limits  TROPONIN I - Abnormal; Notable for the following components:   Troponin I 0.04 (*)    All other components within normal limits  CBC   ____________________________________________  EKG  ED ECG REPORT I, Lavonia Drafts, the attending physician, personally viewed and interpreted this ECG.  Date: 02/04/2018  Rhythm: normal sinus rhythm QRS Axis: normal Intervals: normal ST/T Wave abnormalities: normal Narrative Interpretation: no evidence of acute ischemia  ____________________________________________  RADIOLOGY  None ____________________________________________   PROCEDURES  Procedure(s) performed: No  Procedures   Critical Care performed: No ____________________________________________   INITIAL IMPRESSION / ASSESSMENT AND PLAN / ED COURSE  Pertinent labs & imaging results that were available during my care of the patient were reviewed by me and considered in my medical decision making (see chart  for details).  Patient had episode which appears to be related to anxiety.  Differential includes retinal detachment however symptoms were in both eyes, not consistent with CVA.  Responded rapidly to lorazepam.  No dizziness no chest pain.  Minimally elevated troponin is likely chronic, again no cardiac symptoms.  EKG is unremarkable.  Sodium levels reviewed, her baseline appears to be in the low 130s  Patient states that she has not started Paxil because she is worried about her sodium, I reassured her that she can start it as long she is having her sodium monitored by her PCP.    ____________________________________________   FINAL CLINICAL IMPRESSION(S) / ED DIAGNOSES  Final diagnoses:  Anxiety  Hypertension, unspecified type        Note:  This document was prepared using Dragon voice recognition software and may include unintentional dictation errors.    Lavonia Drafts, MD 02/04/18 1515

## 2018-02-04 NOTE — ED Notes (Signed)
Lab reported troponin 0.04. MD made aware

## 2018-02-04 NOTE — ED Notes (Signed)
Pt reports seeing rainbows and feeling "high blood pressure" behind her eyes. No signs of neuro changes. BP normal now.

## 2018-02-06 DIAGNOSIS — H353122 Nonexudative age-related macular degeneration, left eye, intermediate dry stage: Secondary | ICD-10-CM | POA: Diagnosis not present

## 2018-02-06 DIAGNOSIS — H353113 Nonexudative age-related macular degeneration, right eye, advanced atrophic without subfoveal involvement: Secondary | ICD-10-CM | POA: Diagnosis not present

## 2018-02-06 DIAGNOSIS — H2513 Age-related nuclear cataract, bilateral: Secondary | ICD-10-CM | POA: Diagnosis not present

## 2018-02-06 DIAGNOSIS — H25013 Cortical age-related cataract, bilateral: Secondary | ICD-10-CM | POA: Diagnosis not present

## 2018-02-06 DIAGNOSIS — H01022 Squamous blepharitis right lower eyelid: Secondary | ICD-10-CM | POA: Diagnosis not present

## 2018-02-06 DIAGNOSIS — G43109 Migraine with aura, not intractable, without status migrainosus: Secondary | ICD-10-CM | POA: Diagnosis not present

## 2018-02-06 DIAGNOSIS — H01025 Squamous blepharitis left lower eyelid: Secondary | ICD-10-CM | POA: Diagnosis not present

## 2018-03-05 DIAGNOSIS — E871 Hypo-osmolality and hyponatremia: Secondary | ICD-10-CM | POA: Diagnosis not present

## 2018-03-08 DIAGNOSIS — Z23 Encounter for immunization: Secondary | ICD-10-CM | POA: Diagnosis not present

## 2018-03-08 DIAGNOSIS — F419 Anxiety disorder, unspecified: Secondary | ICD-10-CM | POA: Diagnosis not present

## 2018-03-20 DIAGNOSIS — H01022 Squamous blepharitis right lower eyelid: Secondary | ICD-10-CM | POA: Diagnosis not present

## 2018-03-20 DIAGNOSIS — H353113 Nonexudative age-related macular degeneration, right eye, advanced atrophic without subfoveal involvement: Secondary | ICD-10-CM | POA: Diagnosis not present

## 2018-03-20 DIAGNOSIS — H25013 Cortical age-related cataract, bilateral: Secondary | ICD-10-CM | POA: Diagnosis not present

## 2018-03-20 DIAGNOSIS — G43109 Migraine with aura, not intractable, without status migrainosus: Secondary | ICD-10-CM | POA: Diagnosis not present

## 2018-03-20 DIAGNOSIS — H01025 Squamous blepharitis left lower eyelid: Secondary | ICD-10-CM | POA: Diagnosis not present

## 2018-03-20 DIAGNOSIS — H2513 Age-related nuclear cataract, bilateral: Secondary | ICD-10-CM | POA: Diagnosis not present

## 2018-03-20 DIAGNOSIS — H353122 Nonexudative age-related macular degeneration, left eye, intermediate dry stage: Secondary | ICD-10-CM | POA: Diagnosis not present

## 2018-04-23 DIAGNOSIS — Z79899 Other long term (current) drug therapy: Secondary | ICD-10-CM | POA: Diagnosis not present

## 2018-04-30 DIAGNOSIS — F419 Anxiety disorder, unspecified: Secondary | ICD-10-CM | POA: Diagnosis not present

## 2018-04-30 DIAGNOSIS — F5101 Primary insomnia: Secondary | ICD-10-CM | POA: Diagnosis not present

## 2018-04-30 DIAGNOSIS — I10 Essential (primary) hypertension: Secondary | ICD-10-CM | POA: Diagnosis not present

## 2018-04-30 DIAGNOSIS — Z79899 Other long term (current) drug therapy: Secondary | ICD-10-CM | POA: Diagnosis not present

## 2018-08-02 DIAGNOSIS — H6122 Impacted cerumen, left ear: Secondary | ICD-10-CM | POA: Diagnosis not present

## 2018-08-02 DIAGNOSIS — J301 Allergic rhinitis due to pollen: Secondary | ICD-10-CM | POA: Diagnosis not present

## 2018-08-07 DIAGNOSIS — M79674 Pain in right toe(s): Secondary | ICD-10-CM | POA: Diagnosis not present

## 2018-08-07 DIAGNOSIS — M79675 Pain in left toe(s): Secondary | ICD-10-CM | POA: Diagnosis not present

## 2018-08-07 DIAGNOSIS — B351 Tinea unguium: Secondary | ICD-10-CM | POA: Diagnosis not present

## 2018-10-02 DIAGNOSIS — H01025 Squamous blepharitis left lower eyelid: Secondary | ICD-10-CM | POA: Diagnosis not present

## 2018-10-02 DIAGNOSIS — H25013 Cortical age-related cataract, bilateral: Secondary | ICD-10-CM | POA: Diagnosis not present

## 2018-10-02 DIAGNOSIS — H353113 Nonexudative age-related macular degeneration, right eye, advanced atrophic without subfoveal involvement: Secondary | ICD-10-CM | POA: Diagnosis not present

## 2018-10-02 DIAGNOSIS — H01022 Squamous blepharitis right lower eyelid: Secondary | ICD-10-CM | POA: Diagnosis not present

## 2018-10-02 DIAGNOSIS — H2513 Age-related nuclear cataract, bilateral: Secondary | ICD-10-CM | POA: Diagnosis not present

## 2018-10-02 DIAGNOSIS — G43109 Migraine with aura, not intractable, without status migrainosus: Secondary | ICD-10-CM | POA: Diagnosis not present

## 2018-10-02 DIAGNOSIS — H353122 Nonexudative age-related macular degeneration, left eye, intermediate dry stage: Secondary | ICD-10-CM | POA: Diagnosis not present

## 2018-12-18 DIAGNOSIS — Z79899 Other long term (current) drug therapy: Secondary | ICD-10-CM | POA: Diagnosis not present

## 2018-12-18 DIAGNOSIS — I10 Essential (primary) hypertension: Secondary | ICD-10-CM | POA: Diagnosis not present

## 2018-12-25 DIAGNOSIS — Z1331 Encounter for screening for depression: Secondary | ICD-10-CM | POA: Diagnosis not present

## 2018-12-25 DIAGNOSIS — Z0000 Encounter for general adult medical examination without abnormal findings: Secondary | ICD-10-CM | POA: Diagnosis not present

## 2019-02-08 DIAGNOSIS — E042 Nontoxic multinodular goiter: Secondary | ICD-10-CM | POA: Diagnosis not present

## 2019-02-08 DIAGNOSIS — R1310 Dysphagia, unspecified: Secondary | ICD-10-CM | POA: Diagnosis not present

## 2019-02-08 DIAGNOSIS — M810 Age-related osteoporosis without current pathological fracture: Secondary | ICD-10-CM | POA: Diagnosis not present

## 2019-02-08 DIAGNOSIS — Z79899 Other long term (current) drug therapy: Secondary | ICD-10-CM | POA: Diagnosis not present

## 2019-02-08 DIAGNOSIS — M549 Dorsalgia, unspecified: Secondary | ICD-10-CM | POA: Diagnosis not present

## 2019-02-21 DIAGNOSIS — R350 Frequency of micturition: Secondary | ICD-10-CM | POA: Diagnosis not present

## 2019-02-21 DIAGNOSIS — R399 Unspecified symptoms and signs involving the genitourinary system: Secondary | ICD-10-CM | POA: Diagnosis not present

## 2019-02-21 DIAGNOSIS — G8929 Other chronic pain: Secondary | ICD-10-CM | POA: Diagnosis not present

## 2019-02-21 DIAGNOSIS — M5440 Lumbago with sciatica, unspecified side: Secondary | ICD-10-CM | POA: Diagnosis not present

## 2019-03-05 DIAGNOSIS — M545 Low back pain: Secondary | ICD-10-CM | POA: Diagnosis not present

## 2019-03-27 DIAGNOSIS — M810 Age-related osteoporosis without current pathological fracture: Secondary | ICD-10-CM | POA: Diagnosis not present

## 2019-04-03 DIAGNOSIS — H25013 Cortical age-related cataract, bilateral: Secondary | ICD-10-CM | POA: Diagnosis not present

## 2019-04-03 DIAGNOSIS — G43109 Migraine with aura, not intractable, without status migrainosus: Secondary | ICD-10-CM | POA: Diagnosis not present

## 2019-04-03 DIAGNOSIS — H2513 Age-related nuclear cataract, bilateral: Secondary | ICD-10-CM | POA: Diagnosis not present

## 2019-04-03 DIAGNOSIS — H353122 Nonexudative age-related macular degeneration, left eye, intermediate dry stage: Secondary | ICD-10-CM | POA: Diagnosis not present

## 2019-04-03 DIAGNOSIS — H01025 Squamous blepharitis left lower eyelid: Secondary | ICD-10-CM | POA: Diagnosis not present

## 2019-04-03 DIAGNOSIS — H353113 Nonexudative age-related macular degeneration, right eye, advanced atrophic without subfoveal involvement: Secondary | ICD-10-CM | POA: Diagnosis not present

## 2019-04-03 DIAGNOSIS — H01022 Squamous blepharitis right lower eyelid: Secondary | ICD-10-CM | POA: Diagnosis not present

## 2019-04-30 DIAGNOSIS — M1611 Unilateral primary osteoarthritis, right hip: Secondary | ICD-10-CM | POA: Diagnosis not present

## 2019-04-30 DIAGNOSIS — M1612 Unilateral primary osteoarthritis, left hip: Secondary | ICD-10-CM | POA: Diagnosis not present

## 2019-04-30 DIAGNOSIS — B351 Tinea unguium: Secondary | ICD-10-CM | POA: Diagnosis not present

## 2019-04-30 DIAGNOSIS — M25552 Pain in left hip: Secondary | ICD-10-CM | POA: Diagnosis not present

## 2019-04-30 DIAGNOSIS — M79675 Pain in left toe(s): Secondary | ICD-10-CM | POA: Diagnosis not present

## 2019-04-30 DIAGNOSIS — R102 Pelvic and perineal pain: Secondary | ICD-10-CM | POA: Diagnosis not present

## 2019-04-30 DIAGNOSIS — M25551 Pain in right hip: Secondary | ICD-10-CM | POA: Diagnosis not present

## 2019-04-30 DIAGNOSIS — M79674 Pain in right toe(s): Secondary | ICD-10-CM | POA: Diagnosis not present

## 2019-05-07 DIAGNOSIS — R1031 Right lower quadrant pain: Secondary | ICD-10-CM | POA: Diagnosis not present

## 2019-05-07 DIAGNOSIS — R1032 Left lower quadrant pain: Secondary | ICD-10-CM | POA: Diagnosis not present

## 2019-06-06 IMAGING — US US EXTREM LOW VENOUS*L*
1 series · 13 of 24 positions shown · non-contrast
Comparison: None.

CLINICAL DATA: Left leg pain since 1 p.m..



[Series 1: us extrem low venous*left* · 0.07mm/px · 13 of 34 slices shown]
[im 1/34]
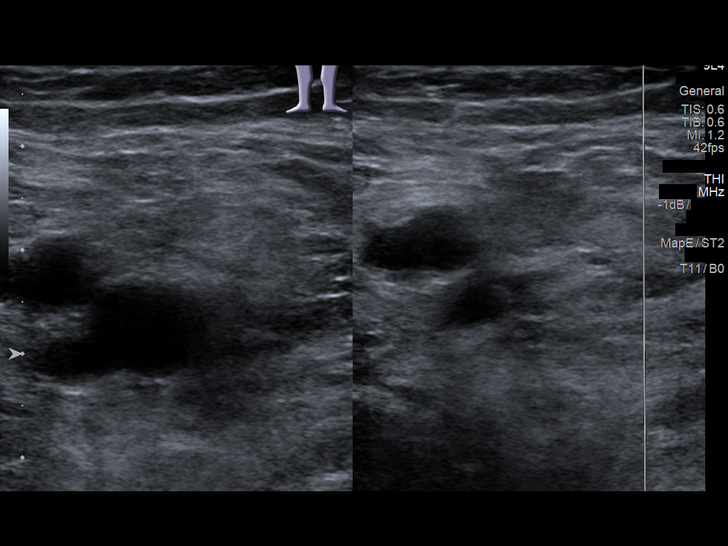
[im 3/34]
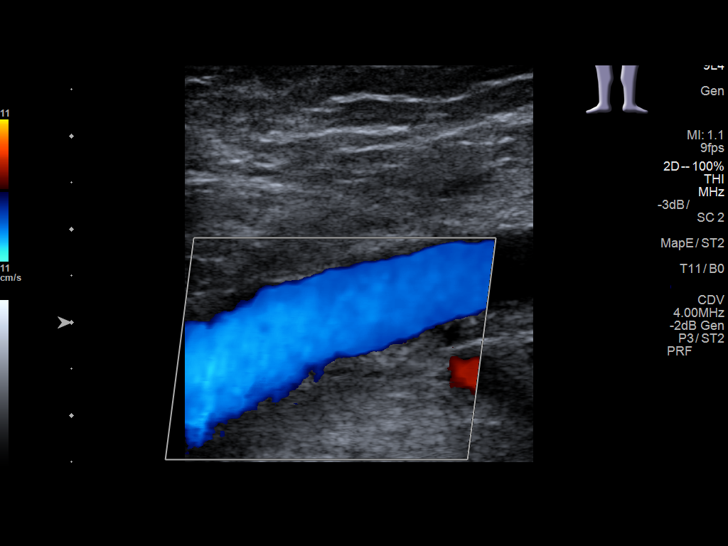
[im 6/34]
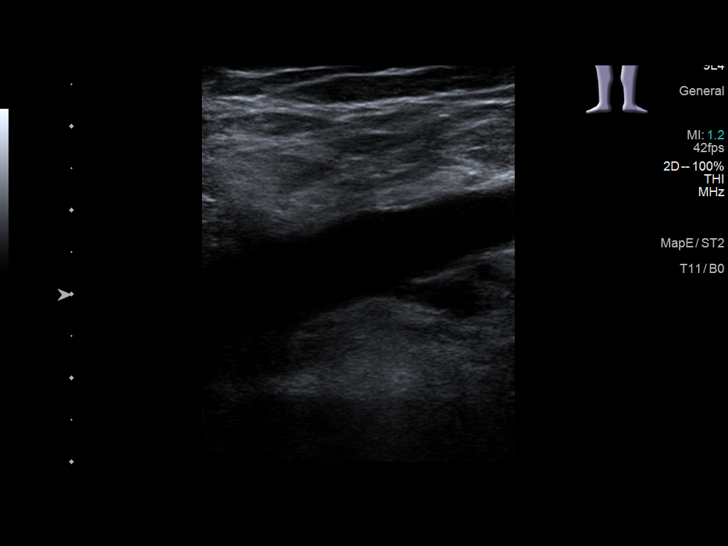
[im 9/34]
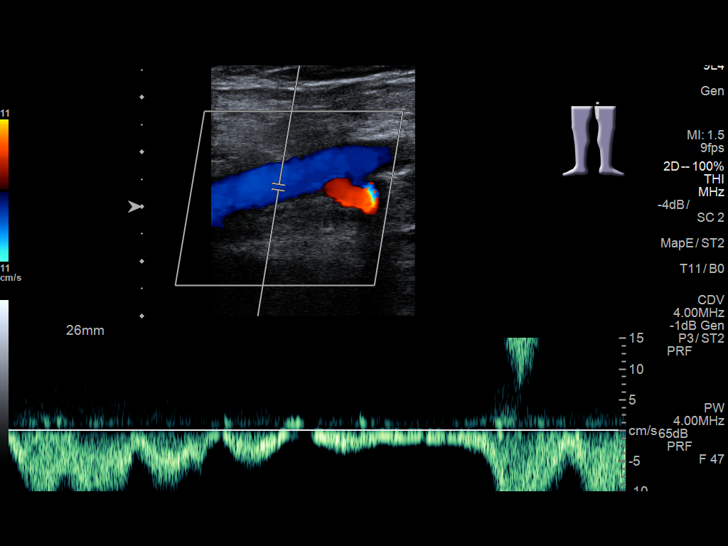
[im 12/34]
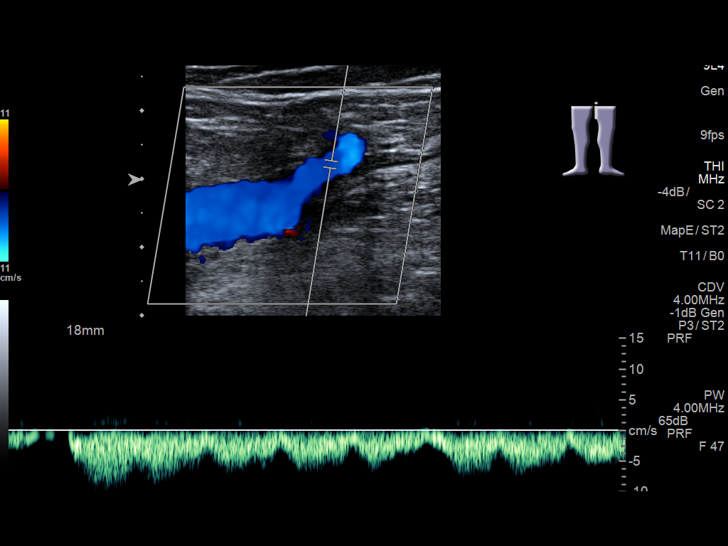
[im 15/34]
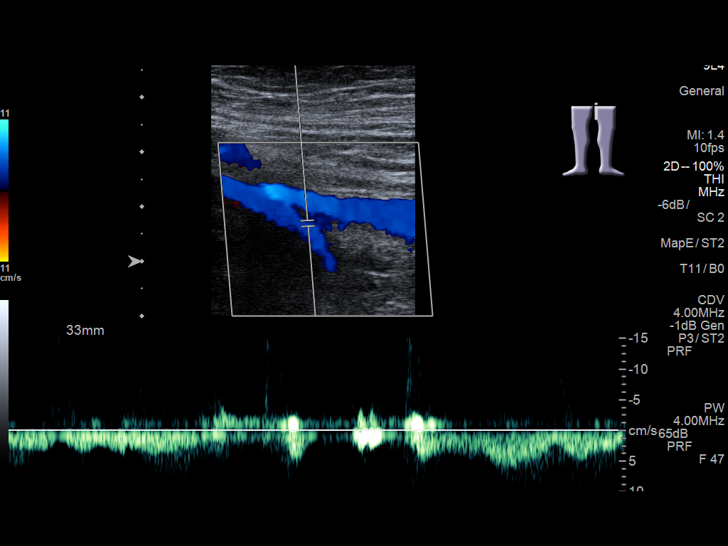
[im 18/34]
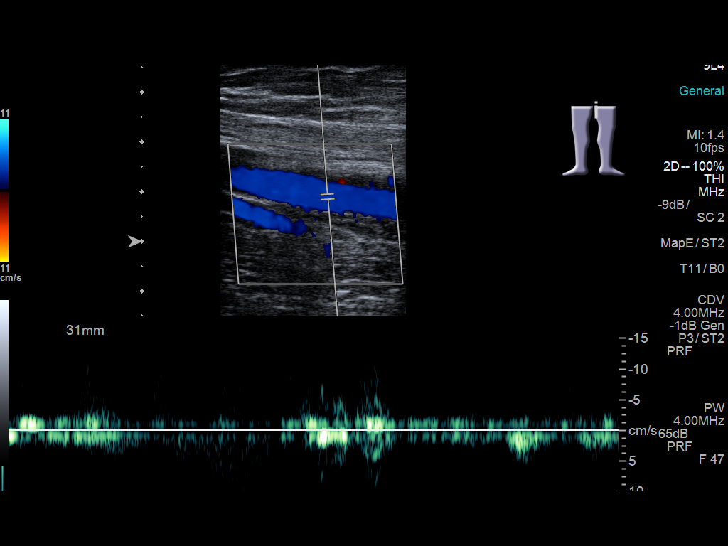
[im 19/34]
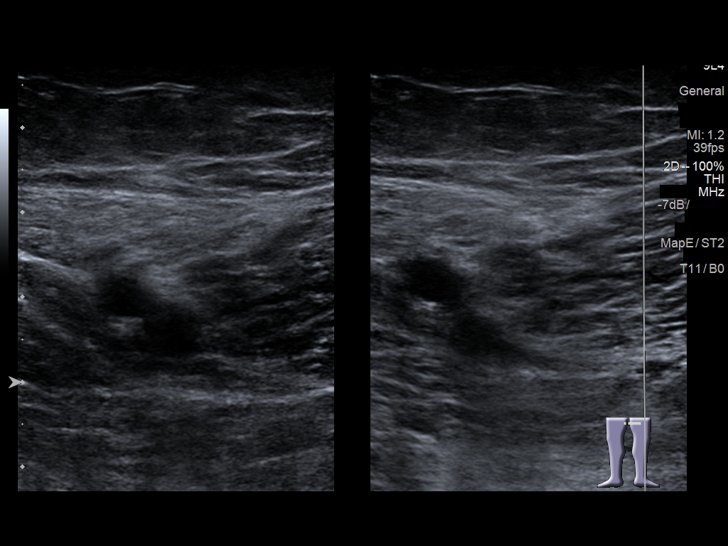
[im 22/34]
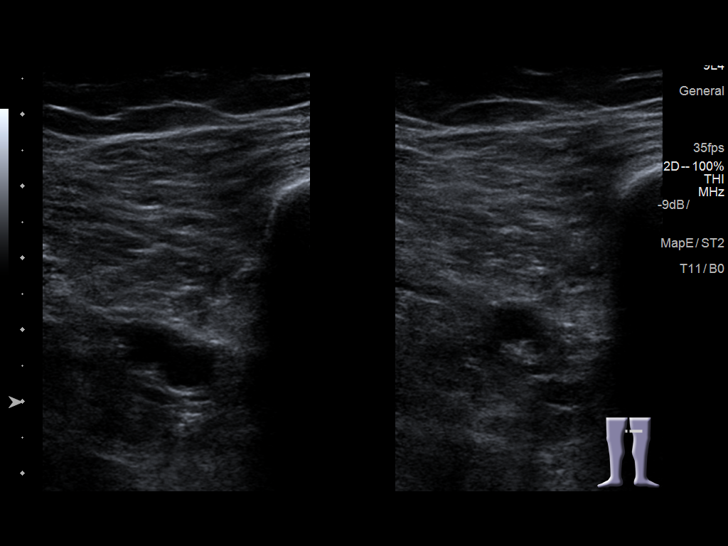
[im 25/34]
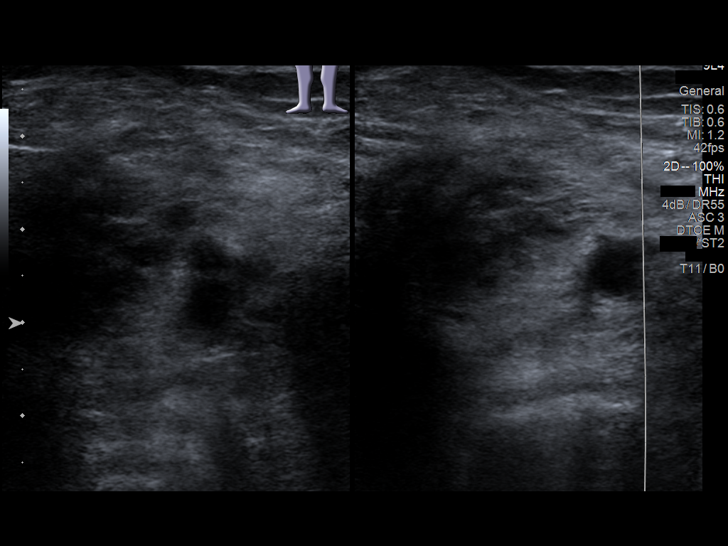
[im 28/34]
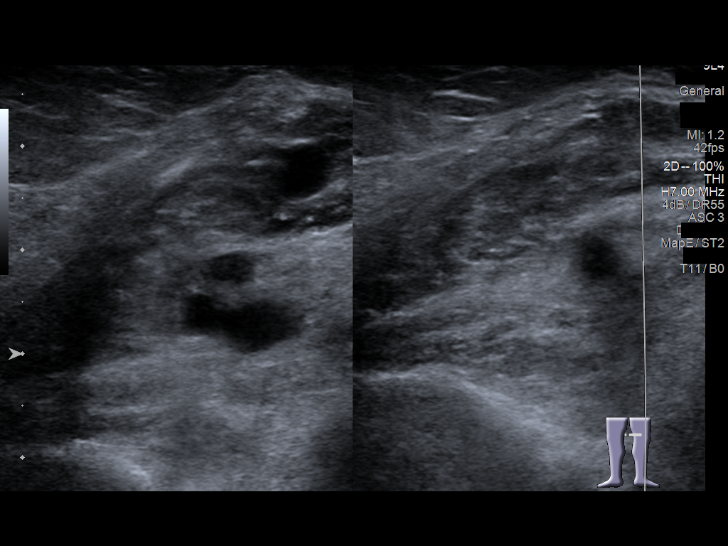
[im 31/34]
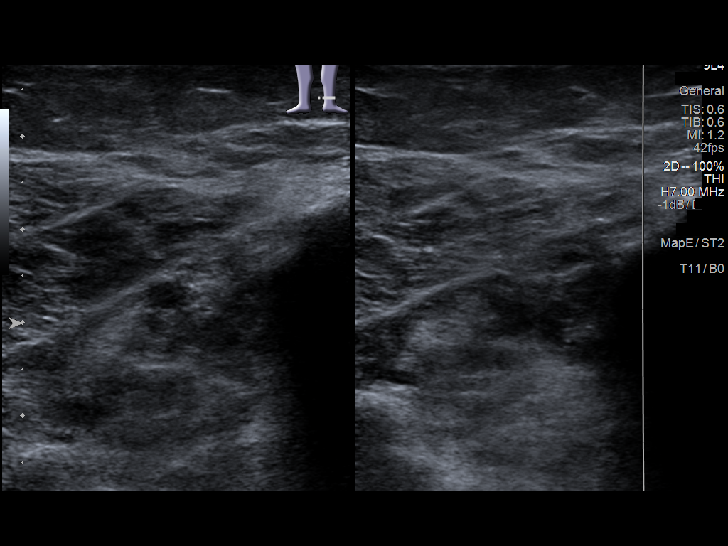
[im 34/34]
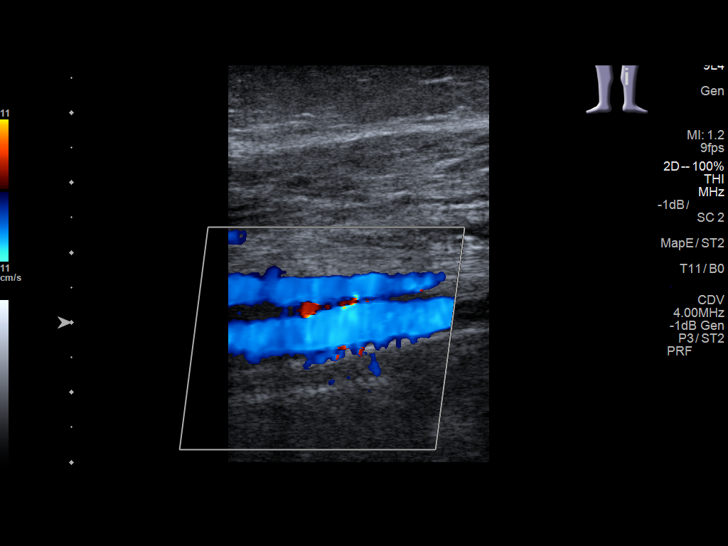

[13 of 24 positions shown; findings below may reference images not displayed]

FINDINGS: Contralateral Common Femoral Vein: Respiratory phasicity is normal
and symmetric with the symptomatic side. No evidence of thrombus.
Normal compressibility.

Common Femoral Vein: No evidence of thrombus. Normal
compressibility, respiratory phasicity and response to augmentation.

Saphenofemoral Junction: No evidence of thrombus. Normal
compressibility and flow on color Doppler imaging.

Profunda Femoral Vein: No evidence of thrombus. Normal
compressibility and flow on color Doppler imaging.

Femoral Vein: No evidence of thrombus. Normal compressibility,
respiratory phasicity and response to augmentation.

Popliteal Vein: No evidence of thrombus. Normal compressibility,
respiratory phasicity and response to augmentation.

Calf Veins: No evidence of thrombus. Normal compressibility and flow
on color Doppler imaging.

Superficial Great Saphenous Vein: No evidence of thrombus. Normal
compressibility.

Other Findings:  None.
IMPRESSION: No evidence of deep venous thrombosis.

## 2019-06-20 DIAGNOSIS — Z79899 Other long term (current) drug therapy: Secondary | ICD-10-CM | POA: Diagnosis not present

## 2019-06-27 DIAGNOSIS — F419 Anxiety disorder, unspecified: Secondary | ICD-10-CM | POA: Diagnosis not present

## 2019-06-27 DIAGNOSIS — M545 Low back pain: Secondary | ICD-10-CM | POA: Diagnosis not present

## 2019-06-27 DIAGNOSIS — G8929 Other chronic pain: Secondary | ICD-10-CM | POA: Diagnosis not present

## 2019-06-27 DIAGNOSIS — J302 Other seasonal allergic rhinitis: Secondary | ICD-10-CM | POA: Diagnosis not present

## 2019-06-27 DIAGNOSIS — Z79899 Other long term (current) drug therapy: Secondary | ICD-10-CM | POA: Diagnosis not present

## 2019-06-27 DIAGNOSIS — I10 Essential (primary) hypertension: Secondary | ICD-10-CM | POA: Diagnosis not present

## 2019-07-11 ENCOUNTER — Emergency Department
Admission: EM | Admit: 2019-07-11 | Discharge: 2019-07-12 | Disposition: A | Discharge status: Home / Self Care | Payer: PPO | Attending: Emergency Medicine | Admitting: Emergency Medicine

## 2019-07-11 ENCOUNTER — Encounter: Payer: Self-pay | Admitting: Emergency Medicine

## 2019-07-11 ENCOUNTER — Emergency Department: Payer: PPO

## 2019-07-11 ENCOUNTER — Other Ambulatory Visit: Payer: Self-pay

## 2019-07-11 DIAGNOSIS — Z20822 Contact with and (suspected) exposure to covid-19: Secondary | ICD-10-CM | POA: Insufficient documentation

## 2019-07-11 DIAGNOSIS — M545 Low back pain, unspecified: Secondary | ICD-10-CM

## 2019-07-11 DIAGNOSIS — E860 Dehydration: Secondary | ICD-10-CM | POA: Insufficient documentation

## 2019-07-11 DIAGNOSIS — K5900 Constipation, unspecified: Secondary | ICD-10-CM | POA: Diagnosis not present

## 2019-07-11 DIAGNOSIS — I10 Essential (primary) hypertension: Secondary | ICD-10-CM | POA: Diagnosis not present

## 2019-07-11 DIAGNOSIS — R1031 Right lower quadrant pain: Secondary | ICD-10-CM | POA: Diagnosis present

## 2019-07-11 DIAGNOSIS — Z79899 Other long term (current) drug therapy: Secondary | ICD-10-CM | POA: Insufficient documentation

## 2019-07-11 DIAGNOSIS — G8929 Other chronic pain: Secondary | ICD-10-CM

## 2019-07-11 DIAGNOSIS — N2889 Other specified disorders of kidney and ureter: Secondary | ICD-10-CM | POA: Diagnosis not present

## 2019-07-11 LAB — CBC
HCT: 40.4 % (ref 36.0–46.0)
Hemoglobin: 14.2 g/dL (ref 12.0–15.0)
MCH: 30.6 pg (ref 26.0–34.0)
MCHC: 35.1 g/dL (ref 30.0–36.0)
MCV: 87.1 fL (ref 80.0–100.0)
Platelets: 205 10*3/uL (ref 150–400)
RBC: 4.64 MIL/uL (ref 3.87–5.11)
RDW: 12.5 % (ref 11.5–15.5)
WBC: 10.3 10*3/uL (ref 4.0–10.5)
nRBC: 0 % (ref 0.0–0.2)

## 2019-07-11 LAB — COMPREHENSIVE METABOLIC PANEL
ALT: 18 U/L (ref 0–44)
AST: 35 U/L (ref 15–41)
Albumin: 4.6 g/dL (ref 3.5–5.0)
Alkaline Phosphatase: 77 U/L (ref 38–126)
Anion gap: 13 (ref 5–15)
BUN: 7 mg/dL — ABNORMAL LOW (ref 8–23)
CO2: 25 mmol/L (ref 22–32)
Calcium: 9.5 mg/dL (ref 8.9–10.3)
Chloride: 87 mmol/L — ABNORMAL LOW (ref 98–111)
Creatinine, Ser: 0.52 mg/dL (ref 0.44–1.00)
GFR calc Af Amer: >60 mL/min (ref >60)
GFR calc non Af Amer: >60 mL/min (ref >60)
Glucose, Bld: 114 mg/dL — ABNORMAL HIGH (ref 70–99)
Potassium: 3.4 mmol/L — ABNORMAL LOW (ref 3.5–5.1)
Sodium: 125 mmol/L — ABNORMAL LOW (ref 135–145)
Total Bilirubin: 1 mg/dL (ref 0.3–1.2)
Total Protein: 7.6 g/dL (ref 6.5–8.1)

## 2019-07-11 LAB — LIPASE, BLOOD: Lipase: 22 U/L (ref 11–51)

## 2019-07-11 LAB — URINALYSIS, COMPLETE (UACMP) WITH MICROSCOPIC
Bacteria, UA: NONE SEEN
Bilirubin Urine: NEGATIVE
Glucose, UA: NEGATIVE mg/dL
Hgb urine dipstick: NEGATIVE
Ketones, ur: 5 mg/dL — AB
Leukocytes,Ua: NEGATIVE
Nitrite: NEGATIVE
Protein, ur: NEGATIVE mg/dL
Specific Gravity, Urine: 1.006 (ref 1.005–1.030)
Squamous Epithelial / LPF: NONE SEEN (ref 0–5)
pH: 7 (ref 5.0–8.0)

## 2019-07-11 MED ORDER — SODIUM CHLORIDE 0.9 % IV BOLUS
1000.0000 mL | Freq: Once | INTRAVENOUS | Status: AC
  Administered 2019-07-11: 1000 mL via INTRAVENOUS

## 2019-07-11 MED ORDER — MORPHINE SULFATE (PF) 4 MG/ML IV SOLN
4.0000 mg | Freq: Once | INTRAVENOUS | Status: AC
  Administered 2019-07-11: 4 mg via INTRAVENOUS
  Filled 2019-07-11: qty 1

## 2019-07-11 MED ORDER — CYCLOBENZAPRINE HCL 10 MG PO TABS
5.0000 mg | ORAL_TABLET | Freq: Once | ORAL | Status: AC
  Administered 2019-07-11: 5 mg via ORAL
  Filled 2019-07-11: qty 1

## 2019-07-11 NOTE — ED Notes (Signed)
Pt helped to the toilet with 2 person assist then placed in bed for her ct.

## 2019-07-11 NOTE — ED Provider Notes (Signed)
-----------------------------------------   11:25 PM on 07/11/2019 -----------------------------------------  Assuming care from Dr. Charna Archer.  In short, Katie Hanna is a 86 y.o. female with a chief complaint of weakness, back pain, hyponatremia.  Refer to the original H&P for additional details.  Dr. Charna Archer discussed with Dr. Sidney Ace already, but the current plan of care after phone discussion is to reassess after fluids and analgesia.  Anticipate admission based on constellation and severity of symptoms which is substantially changed from baseline.    ----------------------------------------- 4:13 AM on 07/12/2019 -----------------------------------------  The patient has now been in the emergency department for more than 12 hours.  She had a rest where she was able to get some sleep and when I awoke her she says she felt much better.  She is no longer having any back pain or spasms and she feels back to normal.  Staff assisted her in order to get her back brace in place, and she was able to ambulate as per the system she uses at home.  When asked directly if she wants to go home or be admitted to the hospital, she says she wants to go home.  Her repeat lab work was reassuring with an improvement of the sodium up to 132, no current back pain, and ability to ambulate.  COVID-19 test was negative.  The patient asked about a prescription for Flexeril but I am not comfortable prescribing it given her inherent unsteadiness and existing back problems and I am afraid that prescribing a medicine for regular use that appears on the beers criteria list could be detrimental.  I encourage the use of over-the-counter medication as well as over-the-counter medications for her chronic constipation and as close as possible follow-up with her primary care provider.  Her husband is coming to pick her up.  I gave my usual customary return precautions.   Hinda Kehr, MD 07/12/19 [phone]

## 2019-07-11 NOTE — ED Provider Notes (Signed)
[facility] Emergency Department Provider Note   ____________________________________________   First MD Initiated Contact with Patient 07/11/19 2042     (approximate)  I have reviewed the triage vital signs and the nursing notes.   HISTORY  Chief Complaint Abdominal Pain    HPI Katie Hanna is a 86 y.o. female with past medical history of hypertension, anxiety, and chronic back pain who presents to the ED complaining of abdominal pain and back pain.  Patient reports that she developed severe pain in the right lower quadrant of her abdomen earlier today.  She describes it as sharp and constant, not exacerbated or alleviated by anything.  She has not had any nausea or vomiting, but does report recent constipation.  She denies any fevers, chills, dysuria, hematuria, or flank pain.  She does complain of acute on chronic pain in both sides of her lower back as well as increasing weakness.  She describes what feels like muscle spasms across much of her back and she has been having difficulty ambulating like usual due to feeling weak.  She denies any numbness in either leg or her groin, has not had any recent urinary retention or incontinence.        Past Medical History:  Diagnosis Date  . Anxiety   . Hypertension   . Osteoporosis     Patient Active Problem List   Diagnosis Date Noted  . Acute anxiety 08/25/2015  . Rhinitis, allergic 08/03/2015  . Back pain, chronic 07/09/2015  . Essential (primary) hypertension 02/23/2015    Past Surgical History:  Procedure Laterality Date  . BACK SURGERY    . EYE MUSCLE SURGERY    . TONSILLECTOMY    . TUBAL LIGATION      Prior to Admission medications   Medication Sig Start Date End Date Taking? Authorizing Provider  acetaminophen-codeine (TYLENOL #3) 300-30 MG tablet Take 1 tablet by mouth every 4 (four) hours as needed for moderate pain. Patient taking differently: Take 0.5 tablets by mouth every evening.   11/11/15   Margarita Rana, MD  atenolol (TENORMIN) 50 MG tablet Take 1 tablet (50 mg total) by mouth daily. Pt needs to FU with new provider before more refills. Thanks. Patient taking differently: Take 25 mg by mouth daily.  02/03/16   Birdie Sons, MD  fluticasone Asencion Islam) 50 MCG/ACT nasal spray INSTILL 2 SPRAYS INTO EACH NOSTRIL EVERY DAY 08/03/15   Margarita Rana, MD  hydrochlorothiazide (HYDRODIURIL) 25 MG tablet Take 1 tablet (25 mg total) by mouth daily. Patient taking differently: Take 12.5 mg by mouth daily.  05/01/16   Birdie Sons, MD  lidocaine (XYLOCAINE) 4 % external solution Apply topically as needed. 12/07/17   Nance Pear, MD  LORazepam (ATIVAN) 0.5 MG tablet Take 1-2 tablets (0.5-1 mg total) by mouth 2 (two) times daily. 08/25/15   Birdie Sons, MD    Allergies Omnipaque [iohexol], Alendronate, and Hydrocodone-acetaminophen  No family history on file.  Social History Social History   Tobacco Use  . Smoking status: Never Smoker  . Smokeless tobacco: Never Used  Substance Use Topics  . Alcohol use: Never  . Drug use: Not on file    Review of Systems  Constitutional: No fever/chills Eyes: No visual changes. ENT: No sore throat. Cardiovascular: Denies chest pain. Respiratory: Denies shortness of breath. Gastrointestinal: Positive for abdominal pain.  No nausea, no vomiting.  No diarrhea.  Positive for constipation. Genitourinary: Negative for dysuria. Musculoskeletal: Positive for back pain. Skin:  Negative for rash. Neurological: Negative for headaches, focal weakness or numbness.  Positive for generalized weakness.  ____________________________________________   PHYSICAL EXAM:  VITAL SIGNS: ED Triage Vitals  Enc Vitals Group     BP 07/11/19 1613 (!) 193/77     Pulse Rate 07/11/19 1613 84     Resp 07/11/19 1613 16     Temp 07/11/19 1846 97.8 F (36.6 C)     Temp src --      SpO2 07/11/19 1613 100 %     Weight --      Height --      Head  Circumference --      Peak Flow --      Pain Score --      Pain Loc --      Pain Edu? --      Excl. in [place]? --     Constitutional: Alert and oriented. Eyes: Conjunctivae are normal. Head: Atraumatic. Nose: No congestion/rhinnorhea. Mouth/Throat: Mucous membranes are moist. Neck: Normal ROM Cardiovascular: Normal rate, regular rhythm. Grossly normal heart sounds. Respiratory: Normal respiratory effort.  No retractions. Lungs CTAB. Gastrointestinal: Soft with diffuse tenderness to palpation but no rebound or guarding. No distention. Genitourinary: deferred Musculoskeletal: No lower extremity tenderness nor edema. Neurologic:  Normal speech and language. No gross focal neurologic deficits are appreciated. Skin:  Skin is warm, dry and intact. No rash noted. Psychiatric: Mood and affect are normal. Speech and behavior are normal.  ____________________________________________   LABS (all labs ordered are listed, but only abnormal results are displayed)  Labs Reviewed  COMPREHENSIVE METABOLIC PANEL - Abnormal; Notable for the following components:      Result Value   Sodium 125 (*)    Potassium 3.4 (*)    Chloride 87 (*)    Glucose, Bld 114 (*)    BUN 7 (*)    All other components within normal limits  URINALYSIS, COMPLETE (UACMP) WITH MICROSCOPIC - Abnormal; Notable for the following components:   Color, Urine STRAW (*)    APPearance CLEAR (*)    Ketones, ur 5 (*)    All other components within normal limits  RESPIRATORY PANEL BY RT PCR (FLU A&B, COVID)  LIPASE, BLOOD  CBC   ____________________________________________   PROCEDURES  Procedure(s) performed (including Critical Care):  Procedures   ____________________________________________   INITIAL IMPRESSION / ASSESSMENT AND PLAN / ED COURSE       86 year old female presents to the ED for abdominal pain, back pain, and increasing generalized weakness over about the past 24 hours.  She denies any new trauma to  her back and does not have any concerning symptoms for cauda equina.  UA without evidence of infection.  CT scan was obtained and is consistent with constipation, otherwise no evidence of appendicitis or other acute process.  Labs are concerning for hyponatremia, which appears to have been an issue for her in the past but has gotten acutely worse since her last BMP.  Given her increasing weakness, she would benefit from admission to address hyponatremia and her generalized weakness.  Case discussed with hospitalist, Dr. Sidney Ace, who recommends hydration with IV fluids and reevaluation.  Patient turned over to Dr. Karma Greaser pending reevaluation.      ____________________________________________   FINAL CLINICAL IMPRESSION(S) / ED DIAGNOSES  Final diagnoses:  Hyponatremia  Constipation, unspecified constipation type  Right lower quadrant abdominal pain  Chronic bilateral low back pain without sciatica     ED Discharge Orders    None  Note:  This document was prepared using Dragon voice recognition software and may include unintentional dictation errors.   Blake Divine, MD 07/11/19 [phone]

## 2019-07-11 NOTE — ED Notes (Signed)
Pt was given crackers to eat and water per request

## 2019-07-11 NOTE — ED Triage Notes (Signed)
Patient presents to the ED with right lower quadrant pain that is severe today.  Patient states she had a similar episode last September.  Patient states she has history of cancer to that area.  Patient reports frequent muscle spasms in her back.  Patient reports multiple back surgeries.  Patient states when she has severe/violent muscle spasms she often loses control of her bowel and bladder.

## 2019-07-12 LAB — BASIC METABOLIC PANEL
Anion gap: 9 (ref 5–15)
BUN: 6 mg/dL — ABNORMAL LOW (ref 8–23)
CO2: 26 mmol/L (ref 22–32)
Calcium: 8.8 mg/dL — ABNORMAL LOW (ref 8.9–10.3)
Chloride: 97 mmol/L — ABNORMAL LOW (ref 98–111)
Creatinine, Ser: 0.48 mg/dL (ref 0.44–1.00)
GFR calc Af Amer: >60 mL/min (ref >60)
GFR calc non Af Amer: >60 mL/min (ref >60)
Glucose, Bld: 106 mg/dL — ABNORMAL HIGH (ref 70–99)
Potassium: 3.5 mmol/L (ref 3.5–5.1)
Sodium: 132 mmol/L — ABNORMAL LOW (ref 135–145)

## 2019-07-12 LAB — RESPIRATORY PANEL BY RT PCR (FLU A&B, COVID)
Influenza A by PCR: NEGATIVE
Influenza B by PCR: NEGATIVE
SARS Coronavirus 2 by RT PCR: NEGATIVE

## 2019-07-12 MED ORDER — SODIUM CHLORIDE 0.9 % IV SOLN: Freq: Once | INTRAVENOUS | Status: AC

## 2019-07-12 NOTE — ED Notes (Signed)
1 lt green lab top and a extra lav blood specimen were sent to the lab for further analysis

## 2019-07-12 NOTE — ED Notes (Signed)
Pt was ambulated by Mayra EDT and this RN using a walker. Pt was fairly stable with occasional shaking (due to her anxiety per pt). Pt st she has no pain at this time and insists that she would like to be discharged home.

## 2019-07-12 NOTE — Discharge Instructions (Signed)
As we discussed, some of your symptoms may have been caused by being a little bit dehydrated.  After fluids you said you felt much better and you are able to get up and walk around as per your usual system.  Your back pain seems to have also resolved and you told us that you want to go home.  That is probably for the best, and I encourage you to read through the included information about constipation because that is an ongoing issue for you.  Follow-up with your regular doctor at the next available opportunity.  Please continue taking your regular medications starting this morning.  For your constipation, we recommend that you use one or more of the following over-the-counter medications in the order described:   1)  Colace (or Dulcolax) 100 mg:  This is a stool softener, and you may take it once or twice a day as needed. 2)  Senna tablets:  This is a bowel stimulant that will help "push" out your stool. It is the next step to add after you have tried a stool softener. 3)  Miralax (powder):  This medication works by drawing additional fluid into your intestines and helps to flush out your stool.  Mix the powder with water or juice according to label instructions.  It may help if the Colace and Senna are not sufficient, but you must be sure to use the recommended amount of water or juice when you mix up the powder. 4)  Look for magnesium citrate at the pharmacy (it is usually a small glass bottle).  Drink the bottle according to the label instructions.  Remember that narcotic pain medications are constipating, so avoid them or minimize their use.  Drink plenty of fluids.  Please return to the Emergency Department immediately if you develop new or worsening symptoms that concern you, such as (but not limited to) fever > 101 degrees, severe abdominal pain, or persistent vomiting.

## 2019-07-12 NOTE — ED Notes (Signed)
Husband notified of pt's pending discharge and is on the way to pick her up

## 2019-08-08 DIAGNOSIS — K5904 Chronic idiopathic constipation: Secondary | ICD-10-CM | POA: Insufficient documentation

## 2019-08-08 DIAGNOSIS — F1190 Opioid use, unspecified, uncomplicated: Secondary | ICD-10-CM | POA: Diagnosis not present

## 2019-08-08 DIAGNOSIS — R1084 Generalized abdominal pain: Secondary | ICD-10-CM | POA: Diagnosis not present

## 2019-08-08 DIAGNOSIS — K589 Irritable bowel syndrome without diarrhea: Secondary | ICD-10-CM | POA: Insufficient documentation

## 2019-08-08 DIAGNOSIS — M545 Low back pain: Secondary | ICD-10-CM | POA: Diagnosis not present

## 2019-08-08 DIAGNOSIS — M810 Age-related osteoporosis without current pathological fracture: Secondary | ICD-10-CM | POA: Diagnosis not present

## 2019-08-08 DIAGNOSIS — G8929 Other chronic pain: Secondary | ICD-10-CM | POA: Diagnosis not present

## 2019-08-08 DIAGNOSIS — K581 Irritable bowel syndrome with constipation: Secondary | ICD-10-CM | POA: Diagnosis not present

## 2019-08-16 DIAGNOSIS — R202 Paresthesia of skin: Secondary | ICD-10-CM | POA: Diagnosis not present

## 2019-08-16 DIAGNOSIS — R5383 Other fatigue: Secondary | ICD-10-CM | POA: Diagnosis not present

## 2019-08-16 DIAGNOSIS — M2550 Pain in unspecified joint: Secondary | ICD-10-CM | POA: Diagnosis not present

## 2019-08-16 DIAGNOSIS — G8929 Other chronic pain: Secondary | ICD-10-CM | POA: Diagnosis not present

## 2019-08-22 ENCOUNTER — Other Ambulatory Visit: Payer: Self-pay | Admitting: Neurology

## 2019-08-22 ENCOUNTER — Other Ambulatory Visit (HOSPITAL_COMMUNITY): Payer: Self-pay | Admitting: Neurology

## 2019-08-22 DIAGNOSIS — G609 Hereditary and idiopathic neuropathy, unspecified: Secondary | ICD-10-CM | POA: Diagnosis not present

## 2019-08-22 DIAGNOSIS — H53001 Unspecified amblyopia, right eye: Secondary | ICD-10-CM | POA: Diagnosis not present

## 2019-08-22 DIAGNOSIS — R413 Other amnesia: Secondary | ICD-10-CM

## 2019-08-22 DIAGNOSIS — E519 Thiamine deficiency, unspecified: Secondary | ICD-10-CM | POA: Diagnosis not present

## 2019-08-22 DIAGNOSIS — E559 Vitamin D deficiency, unspecified: Secondary | ICD-10-CM | POA: Diagnosis not present

## 2019-08-22 DIAGNOSIS — R4189 Other symptoms and signs involving cognitive functions and awareness: Secondary | ICD-10-CM | POA: Diagnosis not present

## 2019-08-22 DIAGNOSIS — R200 Anesthesia of skin: Secondary | ICD-10-CM | POA: Diagnosis not present

## 2019-08-22 DIAGNOSIS — E672 Megavitamin-B6 syndrome: Secondary | ICD-10-CM | POA: Diagnosis not present

## 2019-08-22 DIAGNOSIS — Z79899 Other long term (current) drug therapy: Secondary | ICD-10-CM | POA: Diagnosis not present

## 2019-08-26 ENCOUNTER — Telehealth: Payer: Self-pay

## 2019-08-27 NOTE — Telephone Encounter (Signed)
awaitng records and referral

## 2019-08-29 ENCOUNTER — Telehealth: Payer: Self-pay | Admitting: Infectious Diseases

## 2019-08-29 NOTE — Telephone Encounter (Signed)
Called patient to see where we stood on making an appt yet or not - Was calling to follow up on whether needed to call PCP to get referral and notes or not.  Patient said PCP was referring her to Neuro first and that if needed they will refer her to [place].  Patient seemed to understand & was reassured to call us if need [place]

## 2019-08-29 NOTE — Telephone Encounter (Signed)
-----   Message from Lazarus Gowda, [place] sent at 08/12/2019  7:33 PM EDT ----- This patient called wanting to be seen for possible Lyme Disease. Awaiting Referral and records from PCP then will call her.

## 2019-09-10 DIAGNOSIS — R200 Anesthesia of skin: Secondary | ICD-10-CM | POA: Diagnosis not present

## 2019-09-10 DIAGNOSIS — R202 Paresthesia of skin: Secondary | ICD-10-CM | POA: Diagnosis not present

## 2019-09-11 ENCOUNTER — Ambulatory Visit (HOSPITAL_COMMUNITY)
Admission: RE | Admit: 2019-09-11 | Discharge: 2019-09-11 | Disposition: A | Discharge status: Home / Self Care | Payer: PPO | Source: Ambulatory Visit | Attending: Neurology | Admitting: Neurology

## 2019-09-11 ENCOUNTER — Other Ambulatory Visit: Payer: Self-pay

## 2019-09-11 DIAGNOSIS — R413 Other amnesia: Secondary | ICD-10-CM | POA: Diagnosis not present

## 2019-09-11 DIAGNOSIS — R930 Abnormal findings on diagnostic imaging of skull and head, not elsewhere classified: Secondary | ICD-10-CM | POA: Diagnosis not present

## 2019-09-26 DIAGNOSIS — K5904 Chronic idiopathic constipation: Secondary | ICD-10-CM | POA: Diagnosis not present

## 2019-09-26 DIAGNOSIS — M545 Low back pain: Secondary | ICD-10-CM | POA: Diagnosis not present

## 2019-09-26 DIAGNOSIS — F1190 Opioid use, unspecified, uncomplicated: Secondary | ICD-10-CM | POA: Diagnosis not present

## 2019-09-26 DIAGNOSIS — G8929 Other chronic pain: Secondary | ICD-10-CM | POA: Diagnosis not present

## 2019-10-09 DIAGNOSIS — H53001 Unspecified amblyopia, right eye: Secondary | ICD-10-CM | POA: Diagnosis not present

## 2019-10-09 DIAGNOSIS — G609 Hereditary and idiopathic neuropathy, unspecified: Secondary | ICD-10-CM | POA: Diagnosis not present

## 2019-10-09 DIAGNOSIS — M810 Age-related osteoporosis without current pathological fracture: Secondary | ICD-10-CM | POA: Diagnosis not present

## 2019-10-09 DIAGNOSIS — R4189 Other symptoms and signs involving cognitive functions and awareness: Secondary | ICD-10-CM | POA: Diagnosis not present

## 2019-11-04 DIAGNOSIS — M810 Age-related osteoporosis without current pathological fracture: Secondary | ICD-10-CM | POA: Diagnosis not present

## 2019-11-12 DIAGNOSIS — M810 Age-related osteoporosis without current pathological fracture: Secondary | ICD-10-CM | POA: Diagnosis not present

## 2019-11-12 DIAGNOSIS — Z79899 Other long term (current) drug therapy: Secondary | ICD-10-CM | POA: Diagnosis not present

## 2019-11-15 ENCOUNTER — Other Ambulatory Visit: Payer: Self-pay

## 2019-11-15 ENCOUNTER — Encounter: Payer: Self-pay | Admitting: Emergency Medicine

## 2019-11-15 ENCOUNTER — Emergency Department: Payer: PPO

## 2019-11-15 ENCOUNTER — Observation Stay: Payer: PPO

## 2019-11-15 ENCOUNTER — Observation Stay
Admission: EM | Admit: 2019-11-15 | Discharge: 2019-11-16 | Disposition: A | Discharge status: Home / Self Care | Payer: PPO | Attending: Internal Medicine | Admitting: Internal Medicine

## 2019-11-15 DIAGNOSIS — I639 Cerebral infarction, unspecified: Secondary | ICD-10-CM

## 2019-11-15 DIAGNOSIS — H9190 Unspecified hearing loss, unspecified ear: Secondary | ICD-10-CM | POA: Diagnosis not present

## 2019-11-15 DIAGNOSIS — I081 Rheumatic disorders of both mitral and tricuspid valves: Secondary | ICD-10-CM | POA: Diagnosis not present

## 2019-11-15 DIAGNOSIS — R29818 Other symptoms and signs involving the nervous system: Secondary | ICD-10-CM | POA: Diagnosis not present

## 2019-11-15 DIAGNOSIS — Z91041 Radiographic dye allergy status: Secondary | ICD-10-CM | POA: Diagnosis not present

## 2019-11-15 DIAGNOSIS — R531 Weakness: Secondary | ICD-10-CM | POA: Insufficient documentation

## 2019-11-15 DIAGNOSIS — Z888 Allergy status to other drugs, medicaments and biological substances status: Secondary | ICD-10-CM | POA: Insufficient documentation

## 2019-11-15 DIAGNOSIS — I10 Essential (primary) hypertension: Secondary | ICD-10-CM | POA: Diagnosis not present

## 2019-11-15 DIAGNOSIS — E876 Hypokalemia: Secondary | ICD-10-CM | POA: Insufficient documentation

## 2019-11-15 DIAGNOSIS — R2990 Unspecified symptoms and signs involving the nervous system: Secondary | ICD-10-CM | POA: Diagnosis not present

## 2019-11-15 DIAGNOSIS — Z66 Do not resuscitate: Secondary | ICD-10-CM | POA: Insufficient documentation

## 2019-11-15 DIAGNOSIS — F419 Anxiety disorder, unspecified: Secondary | ICD-10-CM | POA: Insufficient documentation

## 2019-11-15 DIAGNOSIS — R600 Localized edema: Secondary | ICD-10-CM | POA: Insufficient documentation

## 2019-11-15 DIAGNOSIS — Z79899 Other long term (current) drug therapy: Secondary | ICD-10-CM | POA: Diagnosis not present

## 2019-11-15 DIAGNOSIS — R55 Syncope and collapse: Principal | ICD-10-CM | POA: Insufficient documentation

## 2019-11-15 DIAGNOSIS — Z885 Allergy status to narcotic agent status: Secondary | ICD-10-CM | POA: Insufficient documentation

## 2019-11-15 DIAGNOSIS — R4701 Aphasia: Secondary | ICD-10-CM | POA: Diagnosis not present

## 2019-11-15 DIAGNOSIS — Z20822 Contact with and (suspected) exposure to covid-19: Secondary | ICD-10-CM | POA: Insufficient documentation

## 2019-11-15 DIAGNOSIS — M810 Age-related osteoporosis without current pathological fracture: Secondary | ICD-10-CM | POA: Diagnosis not present

## 2019-11-15 DIAGNOSIS — R4781 Slurred speech: Secondary | ICD-10-CM | POA: Diagnosis not present

## 2019-11-15 LAB — COMPREHENSIVE METABOLIC PANEL
ALT: 18 U/L (ref 0–44)
AST: 32 U/L (ref 15–41)
Albumin: 4.4 g/dL (ref 3.5–5.0)
Alkaline Phosphatase: 88 U/L (ref 38–126)
Anion gap: 10 (ref 5–15)
BUN: 9 mg/dL (ref 8–23)
CO2: 26 mmol/L (ref 22–32)
Calcium: 9.4 mg/dL (ref 8.9–10.3)
Chloride: 97 mmol/L — ABNORMAL LOW (ref 98–111)
Creatinine, Ser: 0.56 mg/dL (ref 0.44–1.00)
GFR calc Af Amer: >60 mL/min (ref >60)
GFR calc non Af Amer: >60 mL/min (ref >60)
Glucose, Bld: 155 mg/dL — ABNORMAL HIGH (ref 70–99)
Potassium: 3.3 mmol/L — ABNORMAL LOW (ref 3.5–5.1)
Sodium: 133 mmol/L — ABNORMAL LOW (ref 135–145)
Total Bilirubin: 1.1 mg/dL (ref 0.3–1.2)
Total Protein: 7 g/dL (ref 6.5–8.1)

## 2019-11-15 LAB — GLUCOSE, CAPILLARY: Glucose-Capillary: 147 mg/dL — ABNORMAL HIGH (ref 70–99)

## 2019-11-15 LAB — PROTIME-INR
INR: 0.9 (ref 0.8–1.2)
Prothrombin Time: 11.7 seconds (ref 11.4–15.2)

## 2019-11-15 LAB — DIFFERENTIAL
Abs Immature Granulocytes: 0.03 10*3/uL (ref 0.00–0.07)
Basophils Absolute: 0 10*3/uL (ref 0.0–0.1)
Basophils Relative: 0 %
Eosinophils Absolute: 0 10*3/uL (ref 0.0–0.5)
Eosinophils Relative: 0 %
Immature Granulocytes: 0 %
Lymphocytes Relative: 21 %
Lymphs Abs: 1.9 10*3/uL (ref 0.7–4.0)
Monocytes Absolute: 0.6 10*3/uL (ref 0.1–1.0)
Monocytes Relative: 6 %
Neutro Abs: 6.4 10*3/uL (ref 1.7–7.7)
Neutrophils Relative %: 73 %

## 2019-11-15 LAB — CBC
HCT: 39.4 % (ref 36.0–46.0)
Hemoglobin: 14.2 g/dL (ref 12.0–15.0)
MCH: 30.9 pg (ref 26.0–34.0)
MCHC: 36 g/dL (ref 30.0–36.0)
MCV: 85.8 fL (ref 80.0–100.0)
Platelets: 186 10*3/uL (ref 150–400)
RBC: 4.59 MIL/uL (ref 3.87–5.11)
RDW: 13.3 % (ref 11.5–15.5)
WBC: 8.9 10*3/uL (ref 4.0–10.5)
nRBC: 0 % (ref 0.0–0.2)

## 2019-11-15 LAB — SARS CORONAVIRUS 2 (TAT 6-24 HRS): SARS Coronavirus 2: NEGATIVE

## 2019-11-15 LAB — APTT: aPTT: 32 seconds (ref 24–36)

## 2019-11-15 LAB — BRAIN NATRIURETIC PEPTIDE: B Natriuretic Peptide: 121.6 pg/mL — ABNORMAL HIGH (ref 0.0–100.0)

## 2019-11-15 MED ORDER — ASPIRIN EC 81 MG PO TBEC
81.0000 mg | DELAYED_RELEASE_TABLET | Freq: Every day | ORAL | Status: DC
  Administered 2019-11-16: 10:00:00 81 mg via ORAL
  Filled 2019-11-15: qty 1

## 2019-11-15 MED ORDER — SENNOSIDES-DOCUSATE SODIUM 8.6-50 MG PO TABS: 1.0000 | ORAL_TABLET | Freq: Every evening | ORAL | Status: DC | PRN

## 2019-11-15 MED ORDER — ASCORBIC ACID 500 MG PO TABS
1000.0000 mg | ORAL_TABLET | Freq: Every day | ORAL | Status: DC
  Administered 2019-11-16: 1000 mg via ORAL
  Filled 2019-11-15: qty 2

## 2019-11-15 MED ORDER — POTASSIUM CHLORIDE CRYS ER 20 MEQ PO TBCR
20.0000 meq | EXTENDED_RELEASE_TABLET | Freq: Once | ORAL | Status: AC
  Administered 2019-11-15: 20 meq via ORAL
  Filled 2019-11-15: qty 1

## 2019-11-15 MED ORDER — ACETAMINOPHEN 325 MG PO TABS
650.0000 mg | ORAL_TABLET | Freq: Four times a day (QID) | ORAL | Status: DC | PRN
  Administered 2019-11-16: 10:00:00 650 mg via ORAL
  Filled 2019-11-15: qty 2

## 2019-11-15 MED ORDER — ASPIRIN 81 MG PO CHEW
324.0000 mg | CHEWABLE_TABLET | Freq: Once | ORAL | Status: AC
  Administered 2019-11-15: 324 mg via ORAL
  Filled 2019-11-15: qty 4

## 2019-11-15 MED ORDER — STROKE: EARLY STAGES OF RECOVERY BOOK: Freq: Once | Status: AC

## 2019-11-15 MED ORDER — HYDRALAZINE HCL 20 MG/ML IJ SOLN: 5.0000 mg | INTRAMUSCULAR | Status: DC | PRN

## 2019-11-15 MED ORDER — ATORVASTATIN CALCIUM 20 MG PO TABS
40.0000 mg | ORAL_TABLET | Freq: Every day | ORAL | Status: DC
  Administered 2019-11-16: 40 mg via ORAL
  Filled 2019-11-15: qty 2

## 2019-11-15 MED ORDER — ADULT MULTIVITAMIN W/MINERALS CH
1.0000 | ORAL_TABLET | Freq: Every day | ORAL | Status: DC
  Administered 2019-11-16: 10:00:00 1 via ORAL
  Filled 2019-11-15: qty 1

## 2019-11-15 MED ORDER — ENOXAPARIN SODIUM 40 MG/0.4ML ~~LOC~~ SOLN
40.0000 mg | SUBCUTANEOUS | Status: DC
  Administered 2019-11-15: 40 mg via SUBCUTANEOUS
  Filled 2019-11-15: qty 0.4

## 2019-11-15 MED ORDER — CHOLECALCIFEROL 10 MCG (400 UNIT) PO TABS
400.0000 [IU] | ORAL_TABLET | Freq: Every day | ORAL | Status: DC
  Administered 2019-11-16: 400 [IU] via ORAL
  Filled 2019-11-15 (×2): qty 1

## 2019-11-15 MED ORDER — ONDANSETRON HCL 4 MG/2ML IJ SOLN: 4.0000 mg | Freq: Three times a day (TID) | INTRAMUSCULAR | Status: DC | PRN

## 2019-11-15 MED ORDER — SODIUM CHLORIDE 0.9% FLUSH
3.0000 mL | Freq: Once | INTRAVENOUS | Status: DC
Start: 2019-11-15 — End: 2019-11-15

## 2019-11-15 MED ORDER — CALCIUM CITRATE 950 (200 CA) MG PO TABS
475.0000 mg | ORAL_TABLET | Freq: Every day | ORAL | Status: DC
  Administered 2019-11-16: 10:00:00 475 mg via ORAL
  Filled 2019-11-15 (×2): qty 1

## 2019-11-15 NOTE — ED Notes (Signed)
Patient transported to MRI 

## 2019-11-15 NOTE — ED Notes (Signed)
This RN discussed patient's symptoms with EDP Quentin Cornwall due to pt's only deficit being difficulty with speech and resolving symtpoms upon husband's arrival, per EDP robinson, call Code Stroke. First RN made aware, pt taken to CT by Nicki Reaper, EDT at this time.

## 2019-11-15 NOTE — Consult Note (Signed)
Reason for Consult: speech difficulty  Requesting Physician: Dr. Charna Archer   CC: speech difficulty    HPI: Katie Hanna is an 86 y.o. female with hx of Hard of hearing, HTN, osteoporosis last known well around 2 PM presents with difficulty with speech. She could not communicate in full sentences. Symptoms short lived and appears to have significantly improved. CTH reviewed and has significant atrophy.   Past Medical History:  Diagnosis Date  . Anxiety   . Hypertension   . Osteoporosis     Past Surgical History:  Procedure Laterality Date  . BACK SURGERY    . EYE MUSCLE SURGERY    . TONSILLECTOMY    . TUBAL LIGATION      No family history on file.  Social History:  reports that she has never smoked. She has never used smokeless tobacco. She reports that she does not drink alcohol. No history on file for drug use.  Allergies  Allergen Reactions  . Omnipaque [Iohexol] Itching    Pt states she has an allergy to IV contrast.  Severe itching.  She states " it was like many, many insects trying to get out from under my skin."  She does not want to receive IV contrast. J Bohm 03/24/15.  . Alendronate   . Hydrocodone-Acetaminophen     Medications: I have reviewed the patient's current medications.  ROS: Difficult to obtain as hard of hearing   Physical Examination: Blood pressure (!) 204/92, pulse 92, temperature 97.9 F (36.6 C), temperature source Oral, resp. rate 20, height 4\' 10"  (1.473 m), weight 49.9 kg, SpO2 100 %.    Neurological Examination   Mental Status: Alert, oriented, thought content appropriate.  Cranial Nerves: II: Discs flat bilaterally; Visual fields grossly normal, pupils equal, round, reactive to light and accommodation III,IV, VI: ptosis not present, extra-ocular motions intact bilaterally V,VII: smile symmetric, facial light touch sensation normal bilaterally VIII: HOH Motor: Right : Upper extremity   5/5    Left:     Upper extremity   5/5  Lower  extremity   5/5     Lower extremity   5/5 Tone and bulk:normal tone throughout; no atrophy noted Sensory: Pinprick and light touch intact throughout, bilaterally Deep Tendon Reflexes: 1+      Laboratory Studies:   Basic Metabolic Panel: No results for input(s): NA, K, CL, CO2, GLUCOSE, BUN, CREATININE, CALCIUM, MG, PHOS in the last 168 hours.  Liver Function Tests: No results for input(s): AST, ALT, ALKPHOS, BILITOT, PROT, ALBUMIN in the last 168 hours. No results for input(s): LIPASE, AMYLASE in the last 168 hours. No results for input(s): AMMONIA in the last 168 hours.  CBC: No results for input(s): WBC, NEUTROABS, HGB, HCT, MCV, PLT in the last 168 hours.  Cardiac Enzymes: No results for input(s): CKTOTAL, CKMB, CKMBINDEX, TROPONINI in the last 168 hours.  BNP: Invalid input(s): POCBNP  CBG: No results for input(s): GLUCAP in the last 168 hours.  Microbiology: Results for orders placed or performed during the hospital encounter of 07/11/19  Respiratory Panel by RT PCR (Flu A&B, Covid) - Nasopharyngeal Swab     Status: None   Collection Time: 07/11/19 11:55 PM   Specimen: Nasopharyngeal Swab  Result Value Ref Range Status   SARS Coronavirus 2 by RT PCR NEGATIVE NEGATIVE Final    Comment: (NOTE) SARS-CoV-2 target nucleic acids are NOT DETECTED. The SARS-CoV-2 RNA is generally detectable in upper respiratoy specimens during the acute phase of infection. The lowest concentration of  SARS-CoV-2 viral copies this assay can detect is 131 copies/mL. A negative result does not preclude SARS-Cov-2 infection and should not be used as the sole basis for treatment or other patient management decisions. A negative result may occur with  improper specimen collection/handling, submission of specimen other than nasopharyngeal swab, presence of viral mutation(s) within the areas targeted by this assay, and inadequate number of viral copies (<131 copies/mL). A negative result must be  combined with clinical observations, patient history, and epidemiological information. The expected result is Negative. Fact Sheet for Patients:  PinkCheek.be Fact Sheet for Healthcare Providers:  GravelBags.it This test is not yet ap proved or cleared by the [place] FDA and  has been authorized for detection and/or diagnosis of SARS-CoV-2 by FDA under an Emergency Use Authorization (EUA). This EUA will remain  in effect (meaning this test can be used) for the duration of the COVID-19 declaration under Section 564(b)(1) of the Act, 21 U.S.C. section 360bbb-3(b)(1), unless the authorization is terminated or revoked sooner.    Influenza A by PCR NEGATIVE NEGATIVE Final   Influenza B by PCR NEGATIVE NEGATIVE Final    Comment: (NOTE) The Xpert Xpress SARS-CoV-2/FLU/RSV assay is intended as an aid in  the diagnosis of influenza from Nasopharyngeal swab specimens and  should not be used as a sole basis for treatment. Nasal washings and  aspirates are unacceptable for Xpert Xpress SARS-CoV-2/FLU/RSV  testing. Fact Sheet for Patients: PinkCheek.be Fact Sheet for Healthcare Providers: GravelBags.it This test is not yet approved or cleared by the [place] FDA and  has been authorized for detection and/or diagnosis of SARS-CoV-2 by  FDA under an Emergency Use Authorization (EUA). This EUA will remain  in effect (meaning this test can be used) for the duration of the  Covid-19 declaration under Section 564(b)(1) of the Act, 21  U.S.C. section 360bbb-3(b)(1), unless the authorization is  terminated or revoked. Performed at [facility], [address] [place], [place] [address]     Coagulation Studies: No results for input(s): LABPROT, INR in the last 72 hours.  Urinalysis: No results for input(s): COLORURINE, LABSPEC, PHURINE, GLUCOSEU, HGBUR,  BILIRUBINUR, KETONESUR, PROTEINUR, UROBILINOGEN, NITRITE, LEUKOCYTESUR in the last 168 hours.  Invalid input(s): APPERANCEUR  Lipid Panel:  No results found for: CHOL, TRIG, HDL, CHOLHDL, VLDL, LDLCALC  HgbA1C: No results found for: HGBA1C  Urine Drug Screen:  No results found for: LABOPIA, COCAINSCRNUR, LABBENZ, AMPHETMU, THCU, LABBARB  Alcohol Level: No results for input(s): ETH in the last 168 hours.  Other results: EKG: normal EKG, normal sinus rhythm, unchanged from previous tracings.  Imaging: CT HEAD CODE STROKE WO CONTRAST  Result Date: 11/15/2019 CLINICAL DATA:  Code stroke.  Acute neuro deficit.  Rule out stroke EXAM: CT HEAD WITHOUT CONTRAST TECHNIQUE: Contiguous axial images were obtained from the base of the skull through the vertex without intravenous contrast. COMPARISON:  MRI head 09/11/2019 FINDINGS: Brain: Moderate atrophy. Negative for hydrocephalus. Mild white matter hypodensity bilaterally appears chronic. Negative for acute infarct, hemorrhage, mass Vascular: Negative for hyperdense vessel Skull: No focal skeletal lesion Sinuses/Orbits: Paranasal sinuses clear.  Negative orbit Other: None ASPECTS ([place] Stroke Program Early CT Score) - Ganglionic level infarction (caudate, lentiform nuclei, internal capsule, insula, M1-M3 cortex): 7 - Supraganglionic infarction (M4-M6 cortex): 3 Total score (0-10 with 10 being normal): 10 IMPRESSION: 1. No acute abnormality 2. ASPECTS is 10 3. Atrophy and chronic microvascular ischemic change 4. These results were called by telephone at the time of interpretation on  11/15/2019 at 2:23 pm to provider Merlyn Lot , who verbally acknowledged these results. Electronically Signed   By: Franchot Gallo M.D.   On: 11/15/2019 14:23     Assessment/Plan:  86 y.o. female with hx of Hard of hearing, HTN, osteoporosis last known well around 2 PM presents with difficulty with speech. She could not communicate in full sentences. Symptoms short  lived and appears to have significantly improved. CTH reviewed and has significant atrophy.   - anti platelet therapy/statin - MRI brain  - no TPA as improved and close to baseline.  - permissive HTN overnight  11/15/2019, 2:27 PM

## 2019-11-15 NOTE — ED Provider Notes (Signed)
[facility] Emergency Department Provider Note   ____________________________________________   First MD Initiated Contact with Patient 11/15/19 1418     (approximate)  I have reviewed the triage vital signs and the nursing notes.   HISTORY  Chief Complaint Loss of Consciousness and Weakness    HPI Katie Hanna is a 86 y.o. female with possible history of hypertension, anxiety, and osteoporosis who presents to the ED complaining of syncope and speech changes.  Patient's husband provides the majority of the history and states that patient called him over just prior to arrival because she was feeling very lightheaded and like she might pass out.  Patient states that her vision seemed to go black in both of her eyes during the episode of lightheadedness.  She seemed to drop both of her arms down according to the husband and became very weak.  She started having some difficulty speaking and was only able to say 1 or 2 words at a time.  Speaking difficulty persisted at the time of her arrival to the ED and code stroke was called.  Patient denies any focal numbness or weakness in her extremities, instead felt globally weak.        Past Medical History:  Diagnosis Date   Anxiety    Hypertension    Osteoporosis     Patient Active Problem List   Diagnosis Date Noted   Hypertension    Stroke-like symptom    Hypokalemia    Acute anxiety 08/25/2015   Rhinitis, allergic 08/03/2015   Back pain, chronic 07/09/2015   Essential (primary) hypertension 02/23/2015    Past Surgical History:  Procedure Laterality Date   BACK SURGERY     EYE MUSCLE SURGERY     TONSILLECTOMY     TUBAL LIGATION      Prior to Admission medications   Medication Sig Start Date End Date Taking? Authorizing Provider  atenolol (TENORMIN) 25 MG tablet Take 25 mg by mouth daily.   Yes [provider]  azelastine (ASTELIN) 0.1 % nasal spray Place into the nose.     [provider]  Calcium Citrate 1040 MG TABS Take by mouth.    [provider]  Cholecalciferol 10 MCG (400 UNIT) CAPS Take 1 tablet by mouth 2 (two) times daily.    [provider]  lidocaine (XYLOCAINE) 4 % external solution Apply topically as needed. 12/07/17   Nance Pear, MD  Multiple Vitamin (MULTI-VITAMIN) tablet Take 1 tablet by mouth daily.    [provider]  polyethylene glycol powder (GLYCOLAX/MIRALAX) 17 GM/SCOOP powder Take by mouth.    [provider]    Allergies Omnipaque [iohexol], Alendronate, and Hydrocodone-acetaminophen  No family history on file.  Social History Social History   Tobacco Use   Smoking status: Never Smoker   Smokeless tobacco: Never Used  Substance Use Topics   Alcohol use: Never   Drug use: Not on file    Review of Systems  Constitutional: No fever/chills.  Positive for generalized weakness. Eyes: Positive for visual changes. ENT: No sore throat. Cardiovascular: Denies chest pain. Respiratory: Denies shortness of breath. Gastrointestinal: No abdominal pain.  No nausea, no vomiting.  No diarrhea.  No constipation. Genitourinary: Negative for dysuria. Musculoskeletal: Negative for back pain. Skin: Negative for rash. Neurological: Negative for headaches, focal weakness or numbness.  Positive for speech changes.  ____________________________________________   PHYSICAL EXAM:  VITAL SIGNS: ED Triage Vitals  Enc Vitals Group     BP 11/15/19  1351 (!) 204/92     Pulse Rate 11/15/19 1351 92     Resp 11/15/19 1351 20     Temp 11/15/19 1351 97.9 F (36.6 C)     Temp Source 11/15/19 1351 Oral     SpO2 11/15/19 1351 100 %     Weight 11/15/19 1354 110 lb (49.9 kg)     Height 11/15/19 1354 4\' 10"  (1.473 m)     Head Circumference --      Peak Flow --      Pain Score 11/15/19 1354 0     Pain Loc --      Pain Edu? --      Excl. in [place]? --     Constitutional: Alert and  oriented. Eyes: Conjunctivae are normal. Head: Atraumatic. Nose: No congestion/rhinnorhea. Mouth/Throat: Mucous membranes are moist. Neck: Normal ROM Cardiovascular: Normal rate, regular rhythm. Grossly normal heart sounds. Respiratory: Normal respiratory effort.  No retractions. Lungs CTAB. Gastrointestinal: Soft and nontender. No distention. Genitourinary: deferred Musculoskeletal: No lower extremity tenderness nor edema. Neurologic:  Normal speech and language. No gross focal neurologic deficits are appreciated. Skin:  Skin is warm, dry and intact. No rash noted. Psychiatric: Mood and affect are normal. Speech and behavior are normal.  ____________________________________________   LABS (all labs ordered are listed, but only abnormal results are displayed)  Labs Reviewed  COMPREHENSIVE METABOLIC PANEL - Abnormal; Notable for the following components:      Result Value   Sodium 133 (*)    Potassium 3.3 (*)    Chloride 97 (*)    Glucose, Bld 155 (*)    All other components within normal limits  SARS CORONAVIRUS 2 (TAT 6-24 HRS)  PROTIME-INR  APTT  CBC  DIFFERENTIAL  CBG MONITORING, ED   ____________________________________________  EKG  ED ECG REPORT I, Blake Divine, the attending physician, personally viewed and interpreted this ECG.   Date: 11/15/2019  EKG Time: 13:59  Rate: 87  Rhythm: normal sinus rhythm  Axis: Normal  Intervals:none  ST&T Change: None   PROCEDURES  Procedure(s) performed (including Critical Care):  Procedures   ____________________________________________   INITIAL IMPRESSION / ASSESSMENT AND PLAN / ED COURSE       86 year old female with history of hypertension and anxiety presents to the ED following near syncopal episode where she noticed dark spots in her vision and had some difficulty speaking.  Code stroke was called when patient continued to have difficulty speaking upon presentation to the ED.  CT head is negative for  acute process and she does not appear to have any focal neurologic deficits on exam, speech seems to be gradually improving.  She was evaluated by neurology, who states that given resolving symptoms we will hold off on TPA or CTA.  Lab work is unremarkable, EKG shows no evidence of ischemia or arrhythmia.  Case was discussed with hospitalist for admission and patient loaded with aspirin.      ____________________________________________   FINAL CLINICAL IMPRESSION(S) / ED DIAGNOSES  Final diagnoses:  Aphasia  Near syncope     ED Discharge Orders    None       Note:  This document was prepared using Dragon voice recognition software and may include unintentional dictation errors.   Blake Divine, MD 11/15/19 [phone]

## 2019-11-15 NOTE — Progress Notes (Signed)
Ch visited with Pt and Pt's husband after Code Stroke page. Ch had a conversation with them both for a while. Pt let Ch know that she should not be kept alive if her brain fails. Pt said that she suspects she had a mini-stroke. Pt requested prayer. Ch prayed with Pt and Pt's husband Timmothy Sours.

## 2019-11-15 NOTE — ED Triage Notes (Signed)
Pt presents to ED via POV, pt states "got weak, everything went black". Pt states Is unsure how long "everything was black" Pt noted to be speaking in 1-2 word answer. Pt states feels like speech is different from her baseline, when asked how pt states "slower, can't think". Pt states is unsure when she passed out. Pt's husband states LOC approx 40 minutes ago. Pt's husband states speech is different, reports initially couldn't get patient to speak at all.   Pt reports generalized weakness, denies feeling one sided weakness. Pt with bilateral grip strength weakness, facial symmetry intact, denies numbness/tingling, pt with noted drift to bilateral arms. Pt noted to be lethargic in triage at this time, however able to speak in longer and complete sentences when her husband arrives to triage and is noted to be more alert when her husband arrives.

## 2019-11-15 NOTE — Progress Notes (Signed)
CODE STROKE- PHARMACY COMMUNICATION   Time CODE STROKE called/page received:1410  Time response to CODE STROKE was made (in person or via phone): 1410 (pharmacist available in ED)  Time Stroke Kit retrieved from [place] (only if needed): 1415  Name of Provider/Nurse contacted: no tPA per neurologist Dr. Lynelle Smoke ,PharmD Clinical Pharmacist  11/15/2019  2:43 PM

## 2019-11-15 NOTE — H&P (Signed)
History and Physical    Katie Hanna:[id number] DOB: 06/30/1929 DOA: 11/15/2019  Referring MD/NP/PA:   PCP: Derinda Late, MD   Patient coming from:  The patient is coming from home.  At baseline, pt is independent for most of ADL.        Chief Complaint: Strokelike symptoms  HPI: Katie Hanna is a 86 y.o. female with medical history significant of hypertension, anxiety, osteoporosis, who presents with strokelike symptoms.  Pt states that had vision change and her vision seemed to go black in both of her eyes during the episode of lightheadedness in afternoon. She also had one episode of difficult speaking. She states that she was only able to say 1 or 2 words at a time. She feel weak, but cannot specify if she has unilateral weakness in extremities.  Denies numbness or tingling his extremities.  No facial droop or hearing loss.  Patient does not have chest pain, cough, fever or chills.  She has chronic mild intermittent shortness of breath which has not changed.  She has nausea, no vomiting, diarrhea or abdominal pain. No symptoms of UTI  ED Course: pt was found to have WBC 8.9, INR 0.9, PTT 32, pending COVID-19 PCR, potassium 3.3, renal function okay, temperature normal, blood pressure 204/92, 190/79, heart rate 88, oxygen saturation 100% on room air.  CT head is negative for acute intracranial abnormalities.  Patient is placed on MedSurg bed for observation.  Neurology, Dr. Irish Elders, is consulted.  Review of Systems:   General: no fevers, chills, no body weight gain, has fatigue HEENT: no blurry vision, hearing changes or sore throat Respiratory: has dyspnea, no coughing, wheezing CV: no chest pain, no palpitations GI: has nausea, no vomiting, abdominal pain, diarrhea, constipation GU: no dysuria, burning on urination, increased urinary frequency, hematuria  Ext: has leg edema Neuro: no unilateral numbness, or tingling, no hearing loss. Has vision change and difficulty  speaking Skin: no rash, no skin tear. MSK: No muscle spasm, no deformity, no limitation of range of movement in spin Heme: No easy bruising.  Travel history: No recent long distant travel.  Allergy:  Allergies  Allergen Reactions  . Omnipaque [Iohexol] Itching    Pt states she has an allergy to IV contrast.  Severe itching.  She states " it was like many, many insects trying to get out from under my skin."  She does not want to receive IV contrast. J Bohm 03/24/15.  . Alendronate   . Gluten Meal Other (See Comments)    Unknown Unknown   . Hydrocodone-Acetaminophen   . Other Other (See Comments)    Dairy Products have unknown reaction Dairy Products have unknown reaction   . Wheat Bran Other (See Comments)    Unknown Unknown     Past Medical History:  Diagnosis Date  . Anxiety   . Hypertension   . Osteoporosis     Past Surgical History:  Procedure Laterality Date  . BACK SURGERY    . EYE MUSCLE SURGERY    . TONSILLECTOMY    . TUBAL LIGATION      Social History:  reports that she has never smoked. She has never used smokeless tobacco. She reports that she does not drink alcohol and does not use drugs.  Family History:  Family History  Problem Relation Age of Onset  . Hypertension Brother      Prior to Admission medications   Medication Sig Start Date End Date Taking? Authorizing Provider  acetaminophen-codeine (TYLENOL #  3) 300-30 MG tablet Take 1 tablet by mouth every 4 (four) hours as needed for moderate pain. Patient taking differently: Take 0.5 tablets by mouth every evening.  11/11/15   Margarita Rana, MD  atenolol (TENORMIN) 50 MG tablet Take 1 tablet (50 mg total) by mouth daily. Pt needs to FU with new provider before more refills. Thanks. Patient taking differently: Take 25 mg by mouth daily.  02/03/16   Birdie Sons, MD  fluticasone Asencion Islam) 50 MCG/ACT nasal spray INSTILL 2 SPRAYS INTO EACH NOSTRIL EVERY DAY 08/03/15   Margarita Rana, MD    hydrochlorothiazide (HYDRODIURIL) 25 MG tablet Take 1 tablet (25 mg total) by mouth daily. Patient taking differently: Take 12.5 mg by mouth daily.  05/01/16   Birdie Sons, MD  lidocaine (XYLOCAINE) 4 % external solution Apply topically as needed. 12/07/17   Nance Pear, MD  LORazepam (ATIVAN) 0.5 MG tablet Take 1-2 tablets (0.5-1 mg total) by mouth 2 (two) times daily. 08/25/15   Birdie Sons, MD    Physical Exam: Vitals:   11/15/19 1354 11/15/19 1434 11/15/19 1530 11/15/19 1640  BP:  (!) 190/79 (!) 158/67 (!) 196/84  Pulse:  88 74 81  Resp:  18 11 20   Temp:      TempSrc:      SpO2:  100% 100% 100%  Weight: 49.9 kg     Height: 4\' 10"  (1.473 m)      General: Not in acute distress HEENT:       Eyes: PERRL, EOMI, no scleral icterus.       ENT: No discharge from the ears and nose, no pharynx injection, no tonsillar enlargement.        Neck: No JVD, no bruit, no mass felt. Heme: No neck lymph node enlargement. Cardiac: S1/S2, RRR, No murmurs, No gallops or rubs. Respiratory: No rales, wheezing, rhonchi or rubs. GI: Soft, nondistended, nontender, no rebound pain, no organomegaly, BS present. GU: No hematuria Ext: 1+ pitting leg edema bilaterally. 1+DP/PT pulse bilaterally. Musculoskeletal: No joint deformities, No joint redness or warmth, no limitation of ROM in spin. Skin: No rashes.  Neuro: Alert, oriented X3, cranial nerves II-XII grossly intact, moves all extremities normally. Muscle strength 4/5 in all extremities, sensation to light touch intact. Brachial reflex 2+ bilaterally. Psych: Patient is not psychotic, no suicidal or hemocidal ideation.  Labs on Admission: I have personally reviewed following labs and imaging studies  CBC: Recent Labs  Lab 11/15/19 1339  WBC 8.9  NEUTROABS 6.4  HGB 14.2  HCT 39.4  MCV 85.8  PLT 782   Basic Metabolic Panel: Recent Labs  Lab 11/15/19 1339  NA 133*  K 3.3*  CL 97*  CO2 26  GLUCOSE 155*  BUN 9  CREATININE  0.56  CALCIUM 9.4   GFR: Estimated Creatinine Clearance: 34.1 mL/min (by C-G formula based on SCr of 0.56 mg/dL). Liver Function Tests: Recent Labs  Lab 11/15/19 1339  AST 32  ALT 18  ALKPHOS 88  BILITOT 1.1  PROT 7.0  ALBUMIN 4.4   No results for input(s): LIPASE, AMYLASE in the last 168 hours. No results for input(s): AMMONIA in the last 168 hours. Coagulation Profile: Recent Labs  Lab 11/15/19 1339  INR 0.9   Cardiac Enzymes: No results for input(s): CKTOTAL, CKMB, CKMBINDEX, TROPONINI in the last 168 hours. BNP (last 3 results) No results for input(s): PROBNP in the last 8760 hours. HbA1C: No results for input(s): HGBA1C in the last 72 hours. CBG: No  results for input(s): GLUCAP in the last 168 hours. Lipid Profile: No results for input(s): CHOL, HDL, LDLCALC, TRIG, CHOLHDL, LDLDIRECT in the last 72 hours. Thyroid Function Tests: No results for input(s): TSH, T4TOTAL, FREET4, T3FREE, THYROIDAB in the last 72 hours. Anemia Panel: No results for input(s): VITAMINB12, FOLATE, FERRITIN, TIBC, IRON, RETICCTPCT in the last 72 hours. Urine analysis:    Component Value Date/Time   COLORURINE STRAW (A) 07/11/2019 1626   APPEARANCEUR CLEAR (A) 07/11/2019 1626   LABSPEC 1.006 07/11/2019 1626   PHURINE 7.0 07/11/2019 1626   GLUCOSEU NEGATIVE 07/11/2019 1626   HGBUR NEGATIVE 07/11/2019 1626   BILIRUBINUR NEGATIVE 07/11/2019 1626   KETONESUR 5 (A) 07/11/2019 1626   PROTEINUR NEGATIVE 07/11/2019 1626   NITRITE NEGATIVE 07/11/2019 1626   LEUKOCYTESUR NEGATIVE 07/11/2019 1626   Sepsis Labs: @LABRCNTIP (procalcitonin:4,lacticidven:4) )No results found for this or any previous visit (from the past 240 hour(s)).   Radiological Exams on Admission: CT HEAD CODE STROKE WO CONTRAST  Result Date: 11/15/2019 CLINICAL DATA:  Code stroke.  Acute neuro deficit.  Rule out stroke EXAM: CT HEAD WITHOUT CONTRAST TECHNIQUE: Contiguous axial images were obtained from the base of the skull  through the vertex without intravenous contrast. COMPARISON:  MRI head 09/11/2019 FINDINGS: Brain: Moderate atrophy. Negative for hydrocephalus. Mild white matter hypodensity bilaterally appears chronic. Negative for acute infarct, hemorrhage, mass Vascular: Negative for hyperdense vessel Skull: No focal skeletal lesion Sinuses/Orbits: Paranasal sinuses clear.  Negative orbit Other: None ASPECTS ([place] Stroke Program Early CT Score) - Ganglionic level infarction (caudate, lentiform nuclei, internal capsule, insula, M1-M3 cortex): 7 - Supraganglionic infarction (M4-M6 cortex): 3 Total score (0-10 with 10 being normal): 10 IMPRESSION: 1. No acute abnormality 2. ASPECTS is 10 3. Atrophy and chronic microvascular ischemic change 4. These results were called by telephone at the time of interpretation on 11/15/2019 at 2:23 pm to provider Merlyn Lot , who verbally acknowledged these results. Electronically Signed   By: Franchot Gallo M.D.   On: 11/15/2019 14:23     EKG: Independently reviewed.  Sinus rhythm, QTC 440, low voltage  Assessment/Plan Principal Problem:   Stroke-like symptom Active Problems:   Essential (primary) hypertension   Hypokalemia   Stroke-like symptom: Possibly due to stroke.  CT head negative.  Neurology, Dr. Duffy Bruce, is consulted, who recommended to get MRI of her brain.  -Placed on MedSurg bed for observation - Obtain MRI of brain  - will hold oral Bp meds to allow permissive HTN in the setting of acute stroke  - start ASA and lipitgor - fasting lipid panel and HbA1c  - 2D transthoracic echocardiography (pt has bilateral leg edema) - swallowing screen. If fails, will get SLP - PT/OT consult  Essential (primary) hypertension: - will hold oral Bp meds to allow permissive HTN in the setting of acute stroke  -As needed hydralazine for SBP >220 or DBP>120  Hypokalemia: K= 3.3 on admission. - Repleted - Check Mg level     DVT ppx: SQ Lovenox Code Status: DNR (I  discussed with patient in the presence of her husband, and explained the meaning of CODE STATUS. Patient wants to be DNR) Family Communication:   Yes, patient's husband  at bed side Disposition Plan:  Anticipate discharge back to previous environment Consults called:  Dr. Irish Elders Admission status: Med-surg bed for obs      Status is: Observation  The patient remains OBS appropriate and will d/c before 2 midnights.  Dispo: The patient is from: Home  Anticipated d/c is to: Home              Anticipated d/c date is: 1 day              Patient currently is not medically stable to d/c.          Date of Service 11/15/2019    Ivor Costa [facility]   If 7PM-7AM, please contact night-coverage [email or web address] 11/15/2019, 5:05 PM

## 2019-11-16 ENCOUNTER — Observation Stay (HOSPITAL_BASED_OUTPATIENT_CLINIC_OR_DEPARTMENT_OTHER)
Admit: 2019-11-16 | Discharge: 2019-11-16 | Disposition: A | Discharge status: Home / Self Care | Payer: PPO | Attending: Internal Medicine | Admitting: Internal Medicine

## 2019-11-16 DIAGNOSIS — E876 Hypokalemia: Secondary | ICD-10-CM | POA: Diagnosis not present

## 2019-11-16 DIAGNOSIS — I340 Nonrheumatic mitral (valve) insufficiency: Secondary | ICD-10-CM

## 2019-11-16 DIAGNOSIS — R2990 Unspecified symptoms and signs involving the nervous system: Secondary | ICD-10-CM

## 2019-11-16 DIAGNOSIS — G459 Transient cerebral ischemic attack, unspecified: Secondary | ICD-10-CM | POA: Diagnosis not present

## 2019-11-16 DIAGNOSIS — I10 Essential (primary) hypertension: Secondary | ICD-10-CM | POA: Diagnosis not present

## 2019-11-16 DIAGNOSIS — I361 Nonrheumatic tricuspid (valve) insufficiency: Secondary | ICD-10-CM | POA: Diagnosis not present

## 2019-11-16 DIAGNOSIS — I639 Cerebral infarction, unspecified: Secondary | ICD-10-CM | POA: Diagnosis not present

## 2019-11-16 LAB — HEMOGLOBIN A1C
Hgb A1c MFr Bld: 5.4 % (ref 4.8–5.6)
Mean Plasma Glucose: 108.28 mg/dL

## 2019-11-16 LAB — LIPID PANEL
Cholesterol: 195 mg/dL (ref 0–200)
HDL: 85 mg/dL (ref >40)
LDL Cholesterol: 104 mg/dL — ABNORMAL HIGH (ref 0–99)
Total CHOL/HDL Ratio: 2.3 RATIO
Triglycerides: 29 mg/dL (ref <150)
VLDL: 6 mg/dL (ref 0–40)

## 2019-11-16 LAB — ECHOCARDIOGRAM COMPLETE
Height: 58 in
Weight: 1760 oz

## 2019-11-16 LAB — MAGNESIUM: Magnesium: 2.2 mg/dL (ref 1.7–2.4)

## 2019-11-16 MED ORDER — ASPIRIN 81 MG PO TBEC: 81.0000 mg | DELAYED_RELEASE_TABLET | Freq: Every day | ORAL | 0 refills | Status: DC

## 2019-11-16 MED ORDER — ATORVASTATIN CALCIUM 40 MG PO TABS: 40.0000 mg | ORAL_TABLET | Freq: Every day | ORAL | 0 refills | Status: DC

## 2019-11-16 MED ORDER — METHOCARBAMOL 500 MG PO TABS
500.0000 mg | ORAL_TABLET | Freq: Four times a day (QID) | ORAL | Status: DC | PRN
  Filled 2019-11-16: qty 1

## 2019-11-16 MED ORDER — DICLOFENAC SODIUM 1 % EX GEL
2.0000 g | Freq: Three times a day (TID) | CUTANEOUS | Status: DC | PRN
  Filled 2019-11-16: qty 100

## 2019-11-16 MED ORDER — POTASSIUM CHLORIDE CRYS ER 20 MEQ PO TBCR
20.0000 meq | EXTENDED_RELEASE_TABLET | Freq: Once | ORAL | Status: AC
  Administered 2019-11-16: 20 meq via ORAL
  Filled 2019-11-16: qty 1

## 2019-11-16 MED ORDER — MAGNESIUM OXIDE 400 (241.3 MG) MG PO TABS
200.0000 mg | ORAL_TABLET | Freq: Once | ORAL | Status: AC
  Administered 2019-11-16: 01:00:00 200 mg via ORAL
  Filled 2019-11-16: qty 1

## 2019-11-16 NOTE — Evaluation (Signed)
Speech Language Pathology Evaluation Patient Details Name: Katie Hanna MRN: [id number] DOB: 1929-11-04 Today's Date: 11/16/2019 Time: 1030-1055 SLP Time Calculation (min) (ACUTE ONLY): 25 min  Problem List:  Patient Active Problem List   Diagnosis Date Noted  . Stroke-like symptom   . Hypokalemia   . Acute anxiety 08/25/2015  . Rhinitis, allergic 08/03/2015  . Back pain, chronic 07/09/2015  . Essential (primary) hypertension 02/23/2015   Past Medical History:  Past Medical History:  Diagnosis Date  . Anxiety   . Hypertension   . Osteoporosis    Past Surgical History:  Past Surgical History:  Procedure Laterality Date  . BACK SURGERY    . EYE MUSCLE SURGERY    . TONSILLECTOMY    . TUBAL LIGATION     HPI:  Katie Hanna is a 86 y.o. female with medical history significant of hypertension, anxiety, osteoporosis, who presents with strokelike symptoms.  MRI: No acute intracranial abnormality, mild chronic small vessel ischemic disease.   Assessment / Plan / Recommendation Clinical Impression  Pt presents with adequate cognitive linguistic abilities. Her speech is fully intelligible and there are not impairments identified with pt's expressive or receptive language. Pt is able to communicate wants and needs with no difficulty. For example, pt able to express specific way that she wants to take her medicine. Education completed, ST to sign off at this time.     SLP Assessment  SLP Recommendation/Assessment: Patient does not need any further Speech Lanaguage Pathology Services SLP Visit Diagnosis: Cognitive communication deficit (R41.841)    Follow Up Recommendations  None    Frequency and Duration  N/A         SLP Evaluation Cognition  Overall Cognitive Status: Within Functional Limits for tasks assessed Arousal/Alertness: Awake/alert Orientation Level: Oriented X4       Comprehension  Auditory Comprehension Overall Auditory Comprehension: Appears within  functional limits for tasks assessed Visual Recognition/Discrimination Discrimination: Within Function Limits Reading Comprehension Reading Status: Not tested    Expression Expression Primary Mode of Expression: Verbal Verbal Expression Overall Verbal Expression: Appears within functional limits for tasks assessed Written Expression Dominant Hand: Right Written Expression: Not tested   Oral / Motor  Oral Motor/Sensory Function Overall Oral Motor/Sensory Function: Within functional limits Motor Speech Overall Motor Speech: Appears within functional limits for tasks assessed   GO                   Katie Burleigh B. Rutherford Hanna M.S., CCC-SLP, [place] Office [phone]  Katie Hanna 11/16/2019, 12:14 PM

## 2019-11-16 NOTE — Discharge Summary (Signed)
Physician Discharge Summary  Katie Hanna GOT:[id number] DOB: 1929-10-02 DOA: 11/15/2019  PCP: Derinda Late, MD  Admit date: 11/15/2019 Discharge date: 11/16/2019  Admitted From: Home Disposition: Home  Recommendations for Outpatient Follow-up:  1. Follow up with PCP in 1 week with repeat CBC/BMP 2. Outpatient follow-up with ophthalmology 3. Follow up in ED if symptoms worsen or new appear   Home Health: No Equipment/Devices: None  Discharge Condition: Stable CODE STATUS: DNR Diet recommendation: Heart healthy  Brief/Interim Summary: 86 year old female with history of hypertension, anxiety, osteoporosis presented with strokelike symptoms with vision change and lightheadedness and an episode of difficulty speaking.  Neurology was consulted.  CT of the head was negative for acute abnormality.  Subsequently MRI of the brain was done which was negative for acute stroke.  Her symptoms are improving.  Patient has had issues with her vision chronically and is supposed to follow-up with ophthalmology as an outpatient.  Patient wants to go home.  2D echo report is pending.  She will be discharged home on baby aspirin along with Lipitor; neurology has cleared her for discharge.  Discharge Diagnoses:   Probable TIA -CT of the brain without contrast was negative for acute abnormality.  Subsequently, MRI of the brain was negative for stroke. -2D echo report is pending. -Neurology following: Currently on aspirin 81 mg daily along with Lipitor.  Patient will be discharged home today; neurology has cleared the patient for discharge. -LDL 104.  A1c pending  Essential primary hypertension -Resume home regimen.  Outpatient follow-up  Vision problems -Patient states that her right eye vision has been very poor for a long time  and that she has cataract in the left eye for which she has a follow-up with ophthalmology as an outpatient.  Hypokalemia -Repleted during the  hospitalization  Discharge Instructions   Allergies as of 11/16/2019      Reactions   Omnipaque [iohexol] Itching   Pt states she has an allergy to IV contrast.  Severe itching.  She states " it was like many, many insects trying to get out from under my skin."  She does not want to receive IV contrast. J Bohm 03/24/15.   Alendronate    Gluten Meal Other (See Comments)   Unknown Unknown   Hydrocodone-acetaminophen    Other Other (See Comments)   Dairy Products have unknown reaction Dairy Products have unknown reaction   Wheat Bran Other (See Comments)   Unknown Unknown      Medication List    TAKE these medications   ascorbic acid 1000 MG tablet Commonly known as: VITAMIN C Take 1,000 mg by mouth daily.   aspirin 81 MG EC tablet Take 1 tablet (81 mg total) by mouth daily. Swallow whole. Start taking on: November 17, 2019   atenolol 25 MG tablet Commonly known as: TENORMIN Take 25 mg by mouth daily.   atorvastatin 40 MG tablet Commonly known as: LIPITOR Take 1 tablet (40 mg total) by mouth daily. Start taking on: November 17, 2019   azelastine 0.1 % nasal spray Commonly known as: ASTELIN Place into the nose.   Calcium Citrate 250 MG Tabs Take 500 mg by mouth in the morning, at noon, and at bedtime.   Cholecalciferol 10 MCG (400 UNIT) Chew Chew 400 Units by mouth in the morning and at bedtime.   hydrochlorothiazide 12.5 MG tablet Commonly known as: HYDRODIURIL Take 12.5 mg by mouth daily.   Multi-Vitamin tablet Take 1 tablet by mouth daily.   polyethylene glycol  powder 17 GM/SCOOP powder Commonly known as: GLYCOLAX/MIRALAX Take by mouth.        Follow-up Information    Derinda Late, MD. Schedule an appointment as soon as possible for a visit in 1 week(s).   Specialty: Family Medicine Contact information: [address] [facility] [place] [address] [phone]              Allergies  Allergen  Reactions  . Omnipaque [Iohexol] Itching    Pt states she has an allergy to IV contrast.  Severe itching.  She states " it was like many, many insects trying to get out from under my skin."  She does not want to receive IV contrast. J Bohm 03/24/15.  . Alendronate   . Gluten Meal Other (See Comments)    Unknown Unknown   . Hydrocodone-Acetaminophen   . Other Other (See Comments)    Dairy Products have unknown reaction Dairy Products have unknown reaction   . Wheat Bran Other (See Comments)    Unknown Unknown     Consultations:  Neurology   Procedures/Studies: MR BRAIN WO CONTRAST  Result Date: 11/15/2019 CLINICAL DATA:  Speech disturbance with global weakness and presyncope. EXAM: MRI HEAD WITHOUT CONTRAST TECHNIQUE: Multiplanar, multiecho pulse sequences of the brain and surrounding structures were obtained without intravenous contrast. COMPARISON:  Head CT 11/15/2019 and MRI 09/11/2019 FINDINGS: Brain: There is no evidence of acute infarct, intracranial hemorrhage, mass, midline shift, or extra-axial fluid collection. T2 hyperintensities in the cerebral white matter bilaterally are unchanged from the prior MRI and nonspecific but compatible with mild chronic small vessel ischemic disease. Generalized cerebral atrophy is unchanged and evaluated in detail on the previous MRI. A partially empty sella is again noted. Vascular: Major intracranial vascular flow voids are preserved. Skull and upper cervical spine: Unremarkable bone marrow signal. Sinuses/Orbits: Paranasal sinuses and mastoid air cells are clear. Unremarkable orbits. Other: None. IMPRESSION: 1. No acute intracranial abnormality. 2. Mild chronic small vessel ischemic disease. Electronically Signed   By: Logan Bores M.D.   On: 11/15/2019 17:42   CT HEAD CODE STROKE WO CONTRAST  Result Date: 11/15/2019 CLINICAL DATA:  Code stroke.  Acute neuro deficit.  Rule out stroke EXAM: CT HEAD WITHOUT CONTRAST TECHNIQUE: Contiguous  axial images were obtained from the base of the skull through the vertex without intravenous contrast. COMPARISON:  MRI head 09/11/2019 FINDINGS: Brain: Moderate atrophy. Negative for hydrocephalus. Mild white matter hypodensity bilaterally appears chronic. Negative for acute infarct, hemorrhage, mass Vascular: Negative for hyperdense vessel Skull: No focal skeletal lesion Sinuses/Orbits: Paranasal sinuses clear.  Negative orbit Other: None ASPECTS ([place] Stroke Program Early CT Score) - Ganglionic level infarction (caudate, lentiform nuclei, internal capsule, insula, M1-M3 cortex): 7 - Supraganglionic infarction (M4-M6 cortex): 3 Total score (0-10 with 10 being normal): 10 IMPRESSION: 1. No acute abnormality 2. ASPECTS is 10 3. Atrophy and chronic microvascular ischemic change 4. These results were called by telephone at the time of interpretation on 11/15/2019 at 2:23 pm to provider Merlyn Lot , who verbally acknowledged these results. Electronically Signed   By: Franchot Gallo M.D.   On: 11/15/2019 14:23       Subjective: Patient seen and examined at bedside.  He is awake and is asking to go home today.  Complains of some lower extremity spasms.  No overnight fever, vomiting reported.  States that her symptoms are improving and currently does not have any problem with speech.  Discharge Exam:  Vitals:   11/16/19 0323 11/16/19 0743  BP: 129/87 (!) 161/71  Pulse: 81 82  Resp: 14 17  Temp: 97.8 F (36.6 C) 97.7 F (36.5 C)  SpO2: 97% 98%    General: Pt is alert, awake, not in acute distress.  Elderly female lying in bed. Cardiovascular: rate controlled, S1/S2 + Respiratory: bilateral decreased breath sounds at bases Abdominal: Soft, NT, ND, bowel sounds + Extremities: no edema, no cyanosis    The results of significant diagnostics from this hospitalization (including imaging, microbiology, ancillary and laboratory) are listed below for reference.     Microbiology: Recent  Results (from the past 240 hour(s))  SARS CORONAVIRUS 2 (TAT 6-24 HRS) Nasopharyngeal Nasopharyngeal Swab     Status: None   Collection Time: 11/15/19  4:19 PM   Specimen: Nasopharyngeal Swab  Result Value Ref Range Status   SARS Coronavirus 2 NEGATIVE NEGATIVE Final    Comment: (NOTE) SARS-CoV-2 target nucleic acids are NOT DETECTED.  The SARS-CoV-2 RNA is generally detectable in upper and lower respiratory specimens during the acute phase of infection. Negative results do not preclude SARS-CoV-2 infection, do not rule out co-infections with other pathogens, and should not be used as the sole basis for treatment or other patient management decisions. Negative results must be combined with clinical observations, patient history, and epidemiological information. The expected result is Negative.  Fact Sheet for Patients: SugarRoll.be  Fact Sheet for Healthcare Providers: https://www.woods-mathews.com/  This test is not yet approved or cleared by the [place] FDA and  has been authorized for detection and/or diagnosis of SARS-CoV-2 by FDA under an Emergency Use Authorization (EUA). This EUA will remain  in effect (meaning this test can be used) for the duration of the COVID-19 declaration under Se ction 564(b)(1) of the Act, 21 U.S.C. section 360bbb-3(b)(1), unless the authorization is terminated or revoked sooner.  Performed at [facility], [place] [address], [place], [place] [address]      Labs: BNP (last 3 results) Recent Labs    11/15/19 1339  BNP 409.7*   Basic Metabolic Panel: Recent Labs  Lab 11/15/19 1339 11/16/19 0535  NA 133*  --   K 3.3*  --   CL 97*  --   CO2 26  --   GLUCOSE 155*  --   BUN 9  --   CREATININE 0.56  --   CALCIUM 9.4  --   MG  --  2.2   Liver Function Tests: Recent Labs  Lab 11/15/19 1339  AST 32  ALT 18  ALKPHOS 88  BILITOT 1.1  PROT 7.0  ALBUMIN 4.4   No results for  input(s): LIPASE, AMYLASE in the last 168 hours. No results for input(s): AMMONIA in the last 168 hours. CBC: Recent Labs  Lab 11/15/19 1339  WBC 8.9  NEUTROABS 6.4  HGB 14.2  HCT 39.4  MCV 85.8  PLT 186   Cardiac Enzymes: No results for input(s): CKTOTAL, CKMB, CKMBINDEX, TROPONINI in the last 168 hours. BNP: Invalid input(s): POCBNP CBG: Recent Labs  Lab 11/15/19 1354  GLUCAP 147*   D-Dimer No results for input(s): DDIMER in the last 72 hours. Hgb A1c No results for input(s): HGBA1C in the last 72 hours. Lipid Profile Recent Labs    11/16/19 0535  CHOL 195  HDL 85  LDLCALC 104*  TRIG 29  CHOLHDL 2.3   Thyroid function studies No results for input(s): TSH, T4TOTAL, T3FREE, THYROIDAB in the last 72 hours.  Invalid input(s): FREET3  Anemia work up No results for input(s): VITAMINB12, FOLATE, FERRITIN, TIBC, IRON, RETICCTPCT in the last 72 hours. Urinalysis    Component Value Date/Time   COLORURINE STRAW (A) 07/11/2019 1626   APPEARANCEUR CLEAR (A) 07/11/2019 1626   LABSPEC 1.006 07/11/2019 1626   PHURINE 7.0 07/11/2019 1626   GLUCOSEU NEGATIVE 07/11/2019 1626   HGBUR NEGATIVE 07/11/2019 1626   BILIRUBINUR NEGATIVE 07/11/2019 1626   KETONESUR 5 (A) 07/11/2019 1626   PROTEINUR NEGATIVE 07/11/2019 1626   NITRITE NEGATIVE 07/11/2019 1626   LEUKOCYTESUR NEGATIVE 07/11/2019 1626   Sepsis Labs Invalid input(s): PROCALCITONIN,  WBC,  LACTICIDVEN Microbiology Recent Results (from the past 240 hour(s))  SARS CORONAVIRUS 2 (TAT 6-24 HRS) Nasopharyngeal Nasopharyngeal Swab     Status: None   Collection Time: 11/15/19  4:19 PM   Specimen: Nasopharyngeal Swab  Result Value Ref Range Status   SARS Coronavirus 2 NEGATIVE NEGATIVE Final    Comment: (NOTE) SARS-CoV-2 target nucleic acids are NOT DETECTED.  The SARS-CoV-2 RNA is generally detectable in upper and lower respiratory specimens during the acute phase of infection. Negative results do not preclude  SARS-CoV-2 infection, do not rule out co-infections with other pathogens, and should not be used as the sole basis for treatment or other patient management decisions. Negative results must be combined with clinical observations, patient history, and epidemiological information. The expected result is Negative.  Fact Sheet for Patients: SugarRoll.be  Fact Sheet for Healthcare Providers: https://www.woods-mathews.com/  This test is not yet approved or cleared by the [place] FDA and  has been authorized for detection and/or diagnosis of SARS-CoV-2 by FDA under an Emergency Use Authorization (EUA). This EUA will remain  in effect (meaning this test can be used) for the duration of the COVID-19 declaration under Se ction 564(b)(1) of the Act, 21 U.S.C. section 360bbb-3(b)(1), unless the authorization is terminated or revoked sooner.  Performed at [facility], [place] [address] [place], [place] [address]      Time coordinating discharge: 35 minutes  SIGNED:   Aline August, MD  [facility] 11/16/2019, 10:42 AM

## 2019-11-16 NOTE — Progress Notes (Signed)
Subjective: Pt is back to baseline  Past Medical History:  Diagnosis Date  . Anxiety   . Hypertension   . Osteoporosis     Past Surgical History:  Procedure Laterality Date  . BACK SURGERY    . EYE MUSCLE SURGERY    . TONSILLECTOMY    . TUBAL LIGATION      Family History  Problem Relation Age of Onset  . Hypertension Brother     Social History:  reports that she has never smoked. She has never used smokeless tobacco. She reports that she does not drink alcohol and does not use drugs.  Allergies  Allergen Reactions  . Omnipaque [Iohexol] Itching    Pt states she has an allergy to IV contrast.  Severe itching.  She states " it was like many, many insects trying to get out from under my skin."  She does not want to receive IV contrast. J Bohm 03/24/15.  . Alendronate   . Gluten Meal Other (See Comments)    Unknown Unknown   . Hydrocodone-Acetaminophen   . Other Other (See Comments)    Dairy Products have unknown reaction Dairy Products have unknown reaction   . Wheat Bran Other (See Comments)    Unknown Unknown     Medications: I have reviewed the patient's current medications.  ROS: Difficult to obtain as hard of hearing   Physical Examination: Blood pressure (!) 161/71, pulse 82, temperature 97.7 F (36.5 C), temperature source Oral, resp. rate 17, height 4\' 10"  (1.473 m), weight 49.9 kg, SpO2 98 %.    Neurological Examination   Mental Status: Alert, oriented, thought content appropriate.  Cranial Nerves: II: Discs flat bilaterally; Visual fields grossly normal, pupils equal, round, reactive to light and accommodation III,IV, VI: ptosis not present, extra-ocular motions intact bilaterally V,VII: smile symmetric, facial light touch sensation normal bilaterally VIII: HOH Motor: Right : Upper extremity   5/5    Left:     Upper extremity   5/5  Lower extremity   5/5     Lower extremity   5/5 Tone and bulk:normal tone throughout; no atrophy noted Sensory:  Pinprick and light touch intact throughout, bilaterally Deep Tendon Reflexes: 1+      Laboratory Studies:   Basic Metabolic Panel: Recent Labs  Lab 11/15/19 1339 11/16/19 0535  NA 133*  --   K 3.3*  --   CL 97*  --   CO2 26  --   GLUCOSE 155*  --   BUN 9  --   CREATININE 0.56  --   CALCIUM 9.4  --   MG  --  2.2    Liver Function Tests: Recent Labs  Lab 11/15/19 1339  AST 32  ALT 18  ALKPHOS 88  BILITOT 1.1  PROT 7.0  ALBUMIN 4.4   No results for input(s): LIPASE, AMYLASE in the last 168 hours. No results for input(s): AMMONIA in the last 168 hours.  CBC: Recent Labs  Lab 11/15/19 1339  WBC 8.9  NEUTROABS 6.4  HGB 14.2  HCT 39.4  MCV 85.8  PLT 186    Cardiac Enzymes: No results for input(s): CKTOTAL, CKMB, CKMBINDEX, TROPONINI in the last 168 hours.  BNP: Invalid input(s): POCBNP  CBG: Recent Labs  Lab 11/15/19 1354  GLUCAP 147*    Microbiology: Results for orders placed or performed during the hospital encounter of 11/15/19  SARS CORONAVIRUS 2 (TAT 6-24 HRS) Nasopharyngeal Nasopharyngeal Swab     Status: None   Collection Time:  11/15/19  4:19 PM   Specimen: Nasopharyngeal Swab  Result Value Ref Range Status   SARS Coronavirus 2 NEGATIVE NEGATIVE Final    Comment: (NOTE) SARS-CoV-2 target nucleic acids are NOT DETECTED.  The SARS-CoV-2 RNA is generally detectable in upper and lower respiratory specimens during the acute phase of infection. Negative results do not preclude SARS-CoV-2 infection, do not rule out co-infections with other pathogens, and should not be used as the sole basis for treatment or other patient management decisions. Negative results must be combined with clinical observations, patient history, and epidemiological information. The expected result is Negative.  Fact Sheet for Patients: SugarRoll.be  Fact Sheet for Healthcare Providers: https://www.woods-mathews.com/  This  test is not yet approved or cleared by the [place] FDA and  has been authorized for detection and/or diagnosis of SARS-CoV-2 by FDA under an Emergency Use Authorization (EUA). This EUA will remain  in effect (meaning this test can be used) for the duration of the COVID-19 declaration under Se ction 564(b)(1) of the Act, 21 U.S.C. section 360bbb-3(b)(1), unless the authorization is terminated or revoked sooner.  Performed at [facility], [place] [address], [place], [place] [address]     Coagulation Studies: Recent Labs    11/15/19 1339  LABPROT 11.7  INR 0.9    Urinalysis: No results for input(s): COLORURINE, LABSPEC, PHURINE, GLUCOSEU, HGBUR, BILIRUBINUR, KETONESUR, PROTEINUR, UROBILINOGEN, NITRITE, LEUKOCYTESUR in the last 168 hours.  Invalid input(s): APPERANCEUR  Lipid Panel:     Component Value Date/Time   CHOL 195 11/16/2019 0535   TRIG 29 11/16/2019 0535   HDL 85 11/16/2019 0535   CHOLHDL 2.3 11/16/2019 0535   VLDL 6 11/16/2019 0535   LDLCALC 104 (H) 11/16/2019 0535    HgbA1C: No results found for: HGBA1C  Urine Drug Screen:  No results found for: LABOPIA, COCAINSCRNUR, LABBENZ, AMPHETMU, THCU, LABBARB  Alcohol Level: No results for input(s): ETH in the last 168 hours.  Other results: EKG: normal EKG, normal sinus rhythm, unchanged from previous tracings.  Imaging: MR BRAIN WO CONTRAST  Result Date: 11/15/2019 CLINICAL DATA:  Speech disturbance with global weakness and presyncope. EXAM: MRI HEAD WITHOUT CONTRAST TECHNIQUE: Multiplanar, multiecho pulse sequences of the brain and surrounding structures were obtained without intravenous contrast. COMPARISON:  Head CT 11/15/2019 and MRI 09/11/2019 FINDINGS: Brain: There is no evidence of acute infarct, intracranial hemorrhage, mass, midline shift, or extra-axial fluid collection. T2 hyperintensities in the cerebral white matter bilaterally are unchanged from the prior MRI and nonspecific but compatible with  mild chronic small vessel ischemic disease. Generalized cerebral atrophy is unchanged and evaluated in detail on the previous MRI. A partially empty sella is again noted. Vascular: Major intracranial vascular flow voids are preserved. Skull and upper cervical spine: Unremarkable bone marrow signal. Sinuses/Orbits: Paranasal sinuses and mastoid air cells are clear. Unremarkable orbits. Other: None. IMPRESSION: 1. No acute intracranial abnormality. 2. Mild chronic small vessel ischemic disease. Electronically Signed   By: Logan Bores M.D.   On: 11/15/2019 17:42   CT HEAD CODE STROKE WO CONTRAST  Result Date: 11/15/2019 CLINICAL DATA:  Code stroke.  Acute neuro deficit.  Rule out stroke EXAM: CT HEAD WITHOUT CONTRAST TECHNIQUE: Contiguous axial images were obtained from the base of the skull through the vertex without intravenous contrast. COMPARISON:  MRI head 09/11/2019 FINDINGS: Brain: Moderate atrophy. Negative for hydrocephalus. Mild white matter hypodensity bilaterally appears chronic. Negative for acute infarct, hemorrhage, mass Vascular: Negative for hyperdense vessel Skull: No focal skeletal lesion  Sinuses/Orbits: Paranasal sinuses clear.  Negative orbit Other: None ASPECTS ([place] Stroke Program Early CT Score) - Ganglionic level infarction (caudate, lentiform nuclei, internal capsule, insula, M1-M3 cortex): 7 - Supraganglionic infarction (M4-M6 cortex): 3 Total score (0-10 with 10 being normal): 10 IMPRESSION: 1. No acute abnormality 2. ASPECTS is 10 3. Atrophy and chronic microvascular ischemic change 4. These results were called by telephone at the time of interpretation on 11/15/2019 at 2:23 pm to provider Merlyn Lot , who verbally acknowledged these results. Electronically Signed   By: Franchot Gallo M.D.   On: 11/15/2019 14:23     Assessment/Plan:  86 y.o. female with hx of Hard of hearing, HTN, osteoporosis last known well around 2 PM presents with difficulty with speech. She could not  communicate in full sentences. Symptoms short lived and appears to have significantly improved. CTH reviewed and has significant atrophy.   - MRI brain no acute abnormalities - pt is back to baseline - d/c planning today - ASA 81mg  daily on d/c   11/16/2019, 11:24 AM

## 2019-11-16 NOTE — Evaluation (Signed)
Physical Therapy Evaluation Patient Details Name: Katie Hanna MRN: [id number] DOB: 07/17/1929 Today's Date: 11/16/2019   History of Present Illness  86 year old female with history of hypertension, anxiety, osteoporosis presented with strokelike symptoms with vision change and lightheadedness and an episode of difficulty speaking.  Neurology was consulted.  CT of the head was negative for acute abnormality; subsequent MRI of the brain whch was negative for acute stroke.  Her symptoms are improving.  Patient has had issues with her vision chronically and is supposed to follow-up with ophthalmology as an outpatient.  Neuro has cleared for DC home.  Clinical Impression  Pt is a pleasant 86 year old F who was admitted for stroke-like symptoms and past medical history as detailed above. Pt performs bed mobility with supervision, transfers with CGA, and ambulation with CGA with RW. Pt willingly participates this session, though notes general anxiety with mobility. Pt performs supine>sit with HOB elevated with supervision slowly and carefully, performs STS with CGA from bedside, and ambulates with RW in-room with CGA for safety. Verbal cuing provided throughout session for sequencing for setup with STS transfer (hand placement, scooting to edge of bed) and for safe RW management during ambulation (maintaining feet within base, small degree turns).  Pt reports feeling very tired at end of session; HR and BP within normal limits. Pt demonstrates deficits with strength, balance and activity tolerance requiring continued skilled PT intervention to improve functional mobility and decrease caregiver burden.     Follow Up Recommendations Home health PT    Equipment Recommendations  Rolling walker with 5" wheels    Recommendations for Other Services       Precautions / Restrictions Precautions Precautions: Fall Precaution Comments: pt reports history of multiple compression fractures Restrictions Weight  Bearing Restrictions: No      Mobility  Bed Mobility Overal bed mobility: Needs Assistance Bed Mobility: Supine to Sit     Supine to sit: Supervision     General bed mobility comments: educated on log rolling technique  Transfers Overall transfer level: Needs assistance Equipment used: Rolling walker (2 wheeled) Transfers: Sit to/from Stand Sit to Stand: Min guard         General transfer comment: Moderate verbal cues for sequencing/setup to scoot to edge of bed, push up from bed vs. grabbing RW  Ambulation/Gait Ambulation/Gait assistance: Min guard Gait Distance (Feet): 40 Feet Assistive device: Rolling walker (2 wheeled) Gait Pattern/deviations: Step-through pattern;Decreased step length - right;Decreased step length - left;Narrow base of support     General Gait Details: Flexed posture; slow, small steps, with pt mildly anxious; cuing to stay within base of RW for support and safety with turns  Financial trader Rankin (Stroke Patients Only)       Balance Overall balance assessment: Needs assistance Sitting-balance support: Bilateral upper extremity supported Sitting balance-Leahy Scale: Fair Sitting balance - Comments: Able to safely scoot forward/backward at bedside with UE support on bedside   Standing balance support: During functional activity Standing balance-Leahy Scale: Fair Standing balance comment: Able to safely turn and take steps backward with UE support                             Pertinent Vitals/Pain Pain Assessment: No/denies pain    Home Living Family/patient expects to be discharged to:: Private residence Living Arrangements: Spouse/significant other Available Help at Discharge: Family;Available  24 hours/day Type of Home: House Home Access: Stairs to enter Entrance Stairs-Rails: None Entrance Stairs-Number of Steps: 1 STE; 1 step within home from one room to another Home Layout:  One level Home Equipment: Hand held shower head;Grab bars - tub/shower;Bedside commode;Walker - 4 wheels;Walker - 2 wheels      Prior Function Level of Independence: Independent with assistive device(s)         Comments: Pt reports ambulating household distances with RW; using WC for community distances; Ind ADLs, though reports incontinence and limiting baths due to not wanting to "stress out" husband further     Hand Dominance   Dominant Hand: Right    Extremity/Trunk Assessment   Upper Extremity Assessment Upper Extremity Assessment: Generalized weakness    Lower Extremity Assessment Lower Extremity Assessment: Generalized weakness    Cervical / Trunk Assessment Cervical / Trunk Assessment: Kyphotic  Communication   Communication: HOH  Cognition Arousal/Alertness: Awake/alert Behavior During Therapy: WFL for tasks assessed/performed Overall Cognitive Status: Within Functional Limits for tasks assessed                                        General Comments      Exercises     Assessment/Plan    PT Assessment Patient needs continued PT services  PT Problem List Decreased strength;Decreased mobility;Decreased safety awareness;Decreased balance       PT Treatment Interventions DME instruction;Therapeutic exercise;Gait training;Balance training;Stair training;Neuromuscular re-education;Functional mobility training;Therapeutic activities;Patient/family education    PT Goals (Current goals can be found in the Care Plan section)  Acute Rehab PT Goals Patient Stated Goal: to go home and be safe; be less stressed PT Goal Formulation: With patient Time For Goal Achievement: 11/30/19 Potential to Achieve Goals: Good    Frequency Min 2X/week   Barriers to discharge        Co-evaluation               AM-PAC PT "6 Clicks" Mobility  Outcome Measure Help needed turning from your back to your side while in a flat bed without using bedrails?:  A Little Help needed moving from lying on your back to sitting on the side of a flat bed without using bedrails?: A Little Help needed moving to and from a bed to a chair (including a wheelchair)?: A Little Help needed standing up from a chair using your arms (e.g., wheelchair or bedside chair)?: A Little Help needed to walk in hospital room?: A Little Help needed climbing 3-5 steps with a railing? : A Little 6 Click Score: 18    End of Session Equipment Utilized During Treatment: Gait belt Activity Tolerance: Patient tolerated treatment well Patient left: in chair;with nursing/sitter in room;with family/visitor present;with chair alarm set Nurse Communication: Mobility status PT Visit Diagnosis: Unsteadiness on feet (R26.81);Muscle weakness (generalized) (M62.81)    Time: 7209-4709 PT Time Calculation (min) (ACUTE ONLY): 47 min   Charges:   PT Evaluation $PT Eval Moderate Complexity: 1 Mod PT Treatments $Gait Training: 8-22 mins        Petra Kuba, PT, DPT 11/16/19, 2:09 PM

## 2019-11-16 NOTE — Progress Notes (Signed)
OT Cancellation Note  Patient Details Name: Katie Hanna MRN: [id number] DOB: 06-28-1929   Cancelled Treatment:    Reason Eval/Treat Not Completed: Other (comment)  Pt sitting up in bed eating breakfast - decline OT eval - report she had back pain and spasms in legs the whole night and asked for pain medication.  Did not get any yet. Relayed message to RN - will get her some Tylenol- per pt it does not make her constipated.  Pain 6/10   Rosalyn Gess OTR/L,CLT 11/16/2019, 10:11 AM

## 2019-11-19 DIAGNOSIS — G459 Transient cerebral ischemic attack, unspecified: Secondary | ICD-10-CM | POA: Diagnosis not present

## 2019-11-19 DIAGNOSIS — E7800 Pure hypercholesterolemia, unspecified: Secondary | ICD-10-CM | POA: Diagnosis not present

## 2019-11-19 DIAGNOSIS — I10 Essential (primary) hypertension: Secondary | ICD-10-CM | POA: Diagnosis not present

## 2019-12-05 DIAGNOSIS — H353113 Nonexudative age-related macular degeneration, right eye, advanced atrophic without subfoveal involvement: Secondary | ICD-10-CM | POA: Diagnosis not present

## 2019-12-05 DIAGNOSIS — H25013 Cortical age-related cataract, bilateral: Secondary | ICD-10-CM | POA: Diagnosis not present

## 2019-12-05 DIAGNOSIS — H2513 Age-related nuclear cataract, bilateral: Secondary | ICD-10-CM | POA: Diagnosis not present

## 2019-12-05 DIAGNOSIS — H01025 Squamous blepharitis left lower eyelid: Secondary | ICD-10-CM | POA: Diagnosis not present

## 2019-12-05 DIAGNOSIS — H353122 Nonexudative age-related macular degeneration, left eye, intermediate dry stage: Secondary | ICD-10-CM | POA: Diagnosis not present

## 2019-12-05 DIAGNOSIS — H01022 Squamous blepharitis right lower eyelid: Secondary | ICD-10-CM | POA: Diagnosis not present

## 2019-12-05 DIAGNOSIS — G43109 Migraine with aura, not intractable, without status migrainosus: Secondary | ICD-10-CM | POA: Diagnosis not present

## 2019-12-10 DIAGNOSIS — M810 Age-related osteoporosis without current pathological fracture: Secondary | ICD-10-CM | POA: Diagnosis not present

## 2019-12-10 DIAGNOSIS — E049 Nontoxic goiter, unspecified: Secondary | ICD-10-CM | POA: Diagnosis not present

## 2019-12-19 DIAGNOSIS — I10 Essential (primary) hypertension: Secondary | ICD-10-CM | POA: Diagnosis not present

## 2019-12-19 DIAGNOSIS — Z79899 Other long term (current) drug therapy: Secondary | ICD-10-CM | POA: Diagnosis not present

## 2019-12-26 DIAGNOSIS — Z1331 Encounter for screening for depression: Secondary | ICD-10-CM | POA: Diagnosis not present

## 2019-12-26 DIAGNOSIS — Z0000 Encounter for general adult medical examination without abnormal findings: Secondary | ICD-10-CM | POA: Diagnosis not present

## 2020-02-05 DIAGNOSIS — Z03818 Encounter for observation for suspected exposure to other biological agents ruled out: Secondary | ICD-10-CM | POA: Diagnosis not present

## 2020-02-05 DIAGNOSIS — U071 COVID-19: Secondary | ICD-10-CM | POA: Diagnosis not present

## 2020-02-07 DIAGNOSIS — U071 COVID-19: Secondary | ICD-10-CM | POA: Diagnosis not present

## 2020-02-14 DIAGNOSIS — S22060A Wedge compression fracture of T7-T8 vertebra, initial encounter for closed fracture: Secondary | ICD-10-CM | POA: Diagnosis not present

## 2020-02-14 DIAGNOSIS — I700 Atherosclerosis of aorta: Secondary | ICD-10-CM | POA: Diagnosis not present

## 2020-02-14 DIAGNOSIS — U071 COVID-19: Secondary | ICD-10-CM | POA: Diagnosis not present

## 2020-02-14 DIAGNOSIS — M5386 Other specified dorsopathies, lumbar region: Secondary | ICD-10-CM | POA: Diagnosis not present

## 2020-02-18 DIAGNOSIS — B351 Tinea unguium: Secondary | ICD-10-CM | POA: Diagnosis not present

## 2020-02-18 DIAGNOSIS — M79675 Pain in left toe(s): Secondary | ICD-10-CM | POA: Diagnosis not present

## 2020-02-18 DIAGNOSIS — M79674 Pain in right toe(s): Secondary | ICD-10-CM | POA: Diagnosis not present

## 2020-02-20 DIAGNOSIS — G609 Hereditary and idiopathic neuropathy, unspecified: Secondary | ICD-10-CM | POA: Diagnosis not present

## 2020-02-20 DIAGNOSIS — M810 Age-related osteoporosis without current pathological fracture: Secondary | ICD-10-CM | POA: Diagnosis not present

## 2020-02-20 DIAGNOSIS — Z79899 Other long term (current) drug therapy: Secondary | ICD-10-CM | POA: Diagnosis not present

## 2020-02-20 DIAGNOSIS — R4189 Other symptoms and signs involving cognitive functions and awareness: Secondary | ICD-10-CM | POA: Diagnosis not present

## 2020-03-10 DIAGNOSIS — H25043 Posterior subcapsular polar age-related cataract, bilateral: Secondary | ICD-10-CM | POA: Diagnosis not present

## 2020-03-10 DIAGNOSIS — H25013 Cortical age-related cataract, bilateral: Secondary | ICD-10-CM | POA: Diagnosis not present

## 2020-03-10 DIAGNOSIS — H353131 Nonexudative age-related macular degeneration, bilateral, early dry stage: Secondary | ICD-10-CM | POA: Diagnosis not present

## 2020-03-10 DIAGNOSIS — H2512 Age-related nuclear cataract, left eye: Secondary | ICD-10-CM | POA: Diagnosis not present

## 2020-03-10 DIAGNOSIS — H2513 Age-related nuclear cataract, bilateral: Secondary | ICD-10-CM | POA: Diagnosis not present

## 2020-04-02 DIAGNOSIS — H2589 Other age-related cataract: Secondary | ICD-10-CM | POA: Diagnosis not present

## 2020-04-02 DIAGNOSIS — H2513 Age-related nuclear cataract, bilateral: Secondary | ICD-10-CM | POA: Diagnosis not present

## 2020-04-13 DIAGNOSIS — H2512 Age-related nuclear cataract, left eye: Secondary | ICD-10-CM | POA: Diagnosis not present

## 2020-04-13 DIAGNOSIS — H25012 Cortical age-related cataract, left eye: Secondary | ICD-10-CM | POA: Diagnosis not present

## 2020-04-14 DIAGNOSIS — H2589 Other age-related cataract: Secondary | ICD-10-CM | POA: Diagnosis not present

## 2020-05-04 DIAGNOSIS — H2511 Age-related nuclear cataract, right eye: Secondary | ICD-10-CM | POA: Diagnosis not present

## 2020-05-06 ENCOUNTER — Other Ambulatory Visit: Payer: Self-pay

## 2020-05-06 ENCOUNTER — Encounter (INDEPENDENT_AMBULATORY_CARE_PROVIDER_SITE_OTHER): Payer: PPO | Admitting: Ophthalmology

## 2020-05-06 DIAGNOSIS — H35033 Hypertensive retinopathy, bilateral: Secondary | ICD-10-CM

## 2020-05-06 DIAGNOSIS — H43813 Vitreous degeneration, bilateral: Secondary | ICD-10-CM

## 2020-05-06 DIAGNOSIS — H2701 Aphakia, right eye: Secondary | ICD-10-CM

## 2020-05-06 DIAGNOSIS — H33302 Unspecified retinal break, left eye: Secondary | ICD-10-CM

## 2020-05-06 DIAGNOSIS — I10 Essential (primary) hypertension: Secondary | ICD-10-CM | POA: Diagnosis not present

## 2020-05-06 DIAGNOSIS — H353134 Nonexudative age-related macular degeneration, bilateral, advanced atrophic with subfoveal involvement: Secondary | ICD-10-CM | POA: Diagnosis not present

## 2020-05-06 DIAGNOSIS — H59029 Cataract (lens) fragments in eye following cataract surgery, unspecified eye: Secondary | ICD-10-CM

## 2020-05-20 ENCOUNTER — Other Ambulatory Visit: Payer: Self-pay

## 2020-05-20 ENCOUNTER — Encounter (INDEPENDENT_AMBULATORY_CARE_PROVIDER_SITE_OTHER): Payer: PPO | Admitting: Ophthalmology

## 2020-05-20 DIAGNOSIS — H53001 Unspecified amblyopia, right eye: Secondary | ICD-10-CM | POA: Diagnosis not present

## 2020-05-20 DIAGNOSIS — H353134 Nonexudative age-related macular degeneration, bilateral, advanced atrophic with subfoveal involvement: Secondary | ICD-10-CM

## 2020-05-20 DIAGNOSIS — H43813 Vitreous degeneration, bilateral: Secondary | ICD-10-CM | POA: Diagnosis not present

## 2020-05-20 DIAGNOSIS — I10 Essential (primary) hypertension: Secondary | ICD-10-CM | POA: Diagnosis not present

## 2020-05-20 DIAGNOSIS — H59029 Cataract (lens) fragments in eye following cataract surgery, unspecified eye: Secondary | ICD-10-CM | POA: Diagnosis not present

## 2020-05-20 DIAGNOSIS — H2701 Aphakia, right eye: Secondary | ICD-10-CM | POA: Diagnosis not present

## 2020-05-20 DIAGNOSIS — H35033 Hypertensive retinopathy, bilateral: Secondary | ICD-10-CM

## 2020-06-22 ENCOUNTER — Other Ambulatory Visit: Payer: Self-pay

## 2020-06-22 ENCOUNTER — Encounter (INDEPENDENT_AMBULATORY_CARE_PROVIDER_SITE_OTHER): Payer: PPO | Admitting: Ophthalmology

## 2020-06-22 DIAGNOSIS — H353122 Nonexudative age-related macular degeneration, left eye, intermediate dry stage: Secondary | ICD-10-CM | POA: Diagnosis not present

## 2020-06-22 DIAGNOSIS — H59029 Cataract (lens) fragments in eye following cataract surgery, unspecified eye: Secondary | ICD-10-CM

## 2020-06-22 DIAGNOSIS — H43813 Vitreous degeneration, bilateral: Secondary | ICD-10-CM | POA: Diagnosis not present

## 2020-06-22 DIAGNOSIS — I10 Essential (primary) hypertension: Secondary | ICD-10-CM

## 2020-06-22 DIAGNOSIS — H35033 Hypertensive retinopathy, bilateral: Secondary | ICD-10-CM

## 2020-06-22 DIAGNOSIS — H353114 Nonexudative age-related macular degeneration, right eye, advanced atrophic with subfoveal involvement: Secondary | ICD-10-CM

## 2020-06-25 DIAGNOSIS — Z79899 Other long term (current) drug therapy: Secondary | ICD-10-CM | POA: Diagnosis not present

## 2020-06-25 DIAGNOSIS — I10 Essential (primary) hypertension: Secondary | ICD-10-CM | POA: Diagnosis not present

## 2020-06-30 DIAGNOSIS — G8929 Other chronic pain: Secondary | ICD-10-CM | POA: Diagnosis not present

## 2020-06-30 DIAGNOSIS — R768 Other specified abnormal immunological findings in serum: Secondary | ICD-10-CM | POA: Insufficient documentation

## 2020-06-30 DIAGNOSIS — Z79899 Other long term (current) drug therapy: Secondary | ICD-10-CM | POA: Diagnosis not present

## 2020-06-30 DIAGNOSIS — E7800 Pure hypercholesterolemia, unspecified: Secondary | ICD-10-CM | POA: Diagnosis not present

## 2020-06-30 DIAGNOSIS — I10 Essential (primary) hypertension: Secondary | ICD-10-CM | POA: Diagnosis not present

## 2020-06-30 DIAGNOSIS — F419 Anxiety disorder, unspecified: Secondary | ICD-10-CM | POA: Diagnosis not present

## 2020-06-30 DIAGNOSIS — M545 Low back pain, unspecified: Secondary | ICD-10-CM | POA: Diagnosis not present

## 2020-06-30 DIAGNOSIS — D696 Thrombocytopenia, unspecified: Secondary | ICD-10-CM | POA: Diagnosis not present

## 2020-07-23 ENCOUNTER — Other Ambulatory Visit: Payer: Self-pay

## 2020-07-23 ENCOUNTER — Encounter (INDEPENDENT_AMBULATORY_CARE_PROVIDER_SITE_OTHER): Payer: PPO | Admitting: Ophthalmology

## 2020-07-23 DIAGNOSIS — H353122 Nonexudative age-related macular degeneration, left eye, intermediate dry stage: Secondary | ICD-10-CM

## 2020-07-23 DIAGNOSIS — H353114 Nonexudative age-related macular degeneration, right eye, advanced atrophic with subfoveal involvement: Secondary | ICD-10-CM

## 2020-07-23 DIAGNOSIS — H35033 Hypertensive retinopathy, bilateral: Secondary | ICD-10-CM

## 2020-07-23 DIAGNOSIS — H59029 Cataract (lens) fragments in eye following cataract surgery, unspecified eye: Secondary | ICD-10-CM | POA: Diagnosis not present

## 2020-07-23 DIAGNOSIS — I10 Essential (primary) hypertension: Secondary | ICD-10-CM | POA: Diagnosis not present

## 2020-07-30 DIAGNOSIS — H2701 Aphakia, right eye: Secondary | ICD-10-CM | POA: Diagnosis not present

## 2020-07-30 DIAGNOSIS — H401111 Primary open-angle glaucoma, right eye, mild stage: Secondary | ICD-10-CM | POA: Diagnosis not present

## 2020-07-30 DIAGNOSIS — G43109 Migraine with aura, not intractable, without status migrainosus: Secondary | ICD-10-CM | POA: Diagnosis not present

## 2020-07-30 DIAGNOSIS — H353113 Nonexudative age-related macular degeneration, right eye, advanced atrophic without subfoveal involvement: Secondary | ICD-10-CM | POA: Diagnosis not present

## 2020-07-30 DIAGNOSIS — H353122 Nonexudative age-related macular degeneration, left eye, intermediate dry stage: Secondary | ICD-10-CM | POA: Diagnosis not present

## 2020-07-30 DIAGNOSIS — Z961 Presence of intraocular lens: Secondary | ICD-10-CM | POA: Diagnosis not present

## 2020-08-13 DIAGNOSIS — H353133 Nonexudative age-related macular degeneration, bilateral, advanced atrophic without subfoveal involvement: Secondary | ICD-10-CM | POA: Diagnosis not present

## 2020-08-13 DIAGNOSIS — H4020X1 Unspecified primary angle-closure glaucoma, mild stage: Secondary | ICD-10-CM | POA: Diagnosis not present

## 2020-08-13 DIAGNOSIS — H2701 Aphakia, right eye: Secondary | ICD-10-CM | POA: Diagnosis not present

## 2020-08-13 DIAGNOSIS — Z961 Presence of intraocular lens: Secondary | ICD-10-CM | POA: Diagnosis not present

## 2020-08-18 DIAGNOSIS — M79675 Pain in left toe(s): Secondary | ICD-10-CM | POA: Diagnosis not present

## 2020-08-18 DIAGNOSIS — M79674 Pain in right toe(s): Secondary | ICD-10-CM | POA: Diagnosis not present

## 2020-08-18 DIAGNOSIS — B351 Tinea unguium: Secondary | ICD-10-CM | POA: Diagnosis not present

## 2020-09-15 DIAGNOSIS — H353113 Nonexudative age-related macular degeneration, right eye, advanced atrophic without subfoveal involvement: Secondary | ICD-10-CM | POA: Diagnosis not present

## 2020-09-15 DIAGNOSIS — H353122 Nonexudative age-related macular degeneration, left eye, intermediate dry stage: Secondary | ICD-10-CM | POA: Diagnosis not present

## 2020-09-15 DIAGNOSIS — H401111 Primary open-angle glaucoma, right eye, mild stage: Secondary | ICD-10-CM | POA: Diagnosis not present

## 2020-09-15 DIAGNOSIS — G43109 Migraine with aura, not intractable, without status migrainosus: Secondary | ICD-10-CM | POA: Diagnosis not present

## 2020-09-15 DIAGNOSIS — H2701 Aphakia, right eye: Secondary | ICD-10-CM | POA: Diagnosis not present

## 2020-09-15 DIAGNOSIS — Z961 Presence of intraocular lens: Secondary | ICD-10-CM | POA: Diagnosis not present

## 2020-09-28 DIAGNOSIS — M545 Low back pain, unspecified: Secondary | ICD-10-CM | POA: Diagnosis not present

## 2020-09-28 DIAGNOSIS — M25551 Pain in right hip: Secondary | ICD-10-CM | POA: Diagnosis not present

## 2020-09-28 DIAGNOSIS — G8929 Other chronic pain: Secondary | ICD-10-CM | POA: Diagnosis not present

## 2020-09-28 DIAGNOSIS — M25552 Pain in left hip: Secondary | ICD-10-CM | POA: Diagnosis not present

## 2020-10-08 DIAGNOSIS — H401111 Primary open-angle glaucoma, right eye, mild stage: Secondary | ICD-10-CM | POA: Diagnosis not present

## 2020-10-08 DIAGNOSIS — Z961 Presence of intraocular lens: Secondary | ICD-10-CM | POA: Diagnosis not present

## 2020-10-08 DIAGNOSIS — H353113 Nonexudative age-related macular degeneration, right eye, advanced atrophic without subfoveal involvement: Secondary | ICD-10-CM | POA: Diagnosis not present

## 2020-10-08 DIAGNOSIS — G43109 Migraine with aura, not intractable, without status migrainosus: Secondary | ICD-10-CM | POA: Diagnosis not present

## 2020-10-08 DIAGNOSIS — H2701 Aphakia, right eye: Secondary | ICD-10-CM | POA: Diagnosis not present

## 2020-10-08 DIAGNOSIS — H353122 Nonexudative age-related macular degeneration, left eye, intermediate dry stage: Secondary | ICD-10-CM | POA: Diagnosis not present

## 2020-10-14 DIAGNOSIS — M810 Age-related osteoporosis without current pathological fracture: Secondary | ICD-10-CM | POA: Diagnosis not present

## 2020-10-14 DIAGNOSIS — E049 Nontoxic goiter, unspecified: Secondary | ICD-10-CM | POA: Diagnosis not present

## 2020-10-21 ENCOUNTER — Encounter (INDEPENDENT_AMBULATORY_CARE_PROVIDER_SITE_OTHER): Payer: PPO | Admitting: Ophthalmology

## 2020-11-11 DIAGNOSIS — L821 Other seborrheic keratosis: Secondary | ICD-10-CM | POA: Diagnosis not present

## 2020-11-11 DIAGNOSIS — L570 Actinic keratosis: Secondary | ICD-10-CM | POA: Diagnosis not present

## 2020-11-26 DIAGNOSIS — M79675 Pain in left toe(s): Secondary | ICD-10-CM | POA: Diagnosis not present

## 2020-11-26 DIAGNOSIS — M79674 Pain in right toe(s): Secondary | ICD-10-CM | POA: Diagnosis not present

## 2020-11-26 DIAGNOSIS — R600 Localized edema: Secondary | ICD-10-CM | POA: Diagnosis not present

## 2020-11-26 DIAGNOSIS — B351 Tinea unguium: Secondary | ICD-10-CM | POA: Diagnosis not present

## 2020-11-27 DIAGNOSIS — M810 Age-related osteoporosis without current pathological fracture: Secondary | ICD-10-CM | POA: Diagnosis not present

## 2020-12-03 DIAGNOSIS — G43109 Migraine with aura, not intractable, without status migrainosus: Secondary | ICD-10-CM | POA: Diagnosis not present

## 2020-12-03 DIAGNOSIS — H2701 Aphakia, right eye: Secondary | ICD-10-CM | POA: Diagnosis not present

## 2020-12-03 DIAGNOSIS — H401111 Primary open-angle glaucoma, right eye, mild stage: Secondary | ICD-10-CM | POA: Diagnosis not present

## 2020-12-03 DIAGNOSIS — H353113 Nonexudative age-related macular degeneration, right eye, advanced atrophic without subfoveal involvement: Secondary | ICD-10-CM | POA: Diagnosis not present

## 2020-12-03 DIAGNOSIS — H353122 Nonexudative age-related macular degeneration, left eye, intermediate dry stage: Secondary | ICD-10-CM | POA: Diagnosis not present

## 2020-12-03 DIAGNOSIS — Z961 Presence of intraocular lens: Secondary | ICD-10-CM | POA: Diagnosis not present

## 2020-12-17 DIAGNOSIS — R918 Other nonspecific abnormal finding of lung field: Secondary | ICD-10-CM | POA: Diagnosis not present

## 2020-12-17 DIAGNOSIS — Z20822 Contact with and (suspected) exposure to covid-19: Secondary | ICD-10-CM | POA: Diagnosis not present

## 2020-12-17 DIAGNOSIS — R0781 Pleurodynia: Secondary | ICD-10-CM | POA: Diagnosis not present

## 2020-12-17 DIAGNOSIS — R059 Cough, unspecified: Secondary | ICD-10-CM | POA: Diagnosis not present

## 2020-12-17 DIAGNOSIS — W57XXXA Bitten or stung by nonvenomous insect and other nonvenomous arthropods, initial encounter: Secondary | ICD-10-CM | POA: Diagnosis not present

## 2020-12-22 DIAGNOSIS — E7800 Pure hypercholesterolemia, unspecified: Secondary | ICD-10-CM | POA: Diagnosis not present

## 2020-12-22 DIAGNOSIS — Z79899 Other long term (current) drug therapy: Secondary | ICD-10-CM | POA: Diagnosis not present

## 2020-12-29 DIAGNOSIS — Z1331 Encounter for screening for depression: Secondary | ICD-10-CM | POA: Diagnosis not present

## 2020-12-29 DIAGNOSIS — Z0000 Encounter for general adult medical examination without abnormal findings: Secondary | ICD-10-CM | POA: Diagnosis not present

## 2020-12-29 DIAGNOSIS — Z79899 Other long term (current) drug therapy: Secondary | ICD-10-CM | POA: Diagnosis not present

## 2021-01-14 DIAGNOSIS — L570 Actinic keratosis: Secondary | ICD-10-CM | POA: Diagnosis not present

## 2021-01-14 DIAGNOSIS — X32XXXA Exposure to sunlight, initial encounter: Secondary | ICD-10-CM | POA: Diagnosis not present

## 2021-03-10 DIAGNOSIS — G43109 Migraine with aura, not intractable, without status migrainosus: Secondary | ICD-10-CM | POA: Diagnosis not present

## 2021-03-10 DIAGNOSIS — Z961 Presence of intraocular lens: Secondary | ICD-10-CM | POA: Diagnosis not present

## 2021-03-10 DIAGNOSIS — H353122 Nonexudative age-related macular degeneration, left eye, intermediate dry stage: Secondary | ICD-10-CM | POA: Diagnosis not present

## 2021-03-10 DIAGNOSIS — H401111 Primary open-angle glaucoma, right eye, mild stage: Secondary | ICD-10-CM | POA: Diagnosis not present

## 2021-03-10 DIAGNOSIS — H2701 Aphakia, right eye: Secondary | ICD-10-CM | POA: Diagnosis not present

## 2021-03-10 DIAGNOSIS — H353113 Nonexudative age-related macular degeneration, right eye, advanced atrophic without subfoveal involvement: Secondary | ICD-10-CM | POA: Diagnosis not present

## 2021-03-16 DIAGNOSIS — M79674 Pain in right toe(s): Secondary | ICD-10-CM | POA: Diagnosis not present

## 2021-03-16 DIAGNOSIS — B351 Tinea unguium: Secondary | ICD-10-CM | POA: Diagnosis not present

## 2021-03-16 DIAGNOSIS — M79675 Pain in left toe(s): Secondary | ICD-10-CM | POA: Diagnosis not present

## 2021-03-29 DIAGNOSIS — H353132 Nonexudative age-related macular degeneration, bilateral, intermediate dry stage: Secondary | ICD-10-CM | POA: Diagnosis not present

## 2021-04-01 DIAGNOSIS — H2701 Aphakia, right eye: Secondary | ICD-10-CM | POA: Diagnosis not present

## 2021-04-01 DIAGNOSIS — H353122 Nonexudative age-related macular degeneration, left eye, intermediate dry stage: Secondary | ICD-10-CM | POA: Diagnosis not present

## 2021-04-01 DIAGNOSIS — Z961 Presence of intraocular lens: Secondary | ICD-10-CM | POA: Diagnosis not present

## 2021-04-01 DIAGNOSIS — G43109 Migraine with aura, not intractable, without status migrainosus: Secondary | ICD-10-CM | POA: Diagnosis not present

## 2021-04-01 DIAGNOSIS — H401111 Primary open-angle glaucoma, right eye, mild stage: Secondary | ICD-10-CM | POA: Diagnosis not present

## 2021-04-01 DIAGNOSIS — H353113 Nonexudative age-related macular degeneration, right eye, advanced atrophic without subfoveal involvement: Secondary | ICD-10-CM | POA: Diagnosis not present

## 2021-06-07 IMAGING — CT CT HEAD CODE STROKE
3 series · 15 of 47 positions shown, 18 images · non-contrast
Comparison: MRI head 09/11/2019

CLINICAL DATA: Code stroke.  Acute neuro deficit.  Rule out stroke

EXAM:
CT HEAD WITHOUT CONTRAST
TECHNIQUE: Contiguous axial images were obtained from the base of the skull
through the vertex without intravenous contrast.

[Series 3: head wo · axial · 0.40mm/px · z∈[-121,+4]mm · 9 of 30 slices shown, 12 images]
[im 3/30  brain]
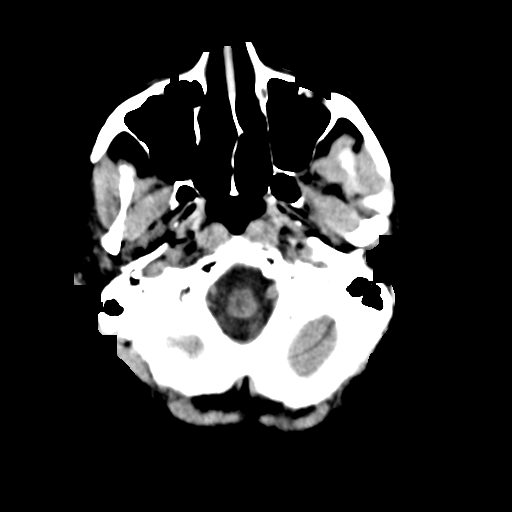
[im 3/30  bone]
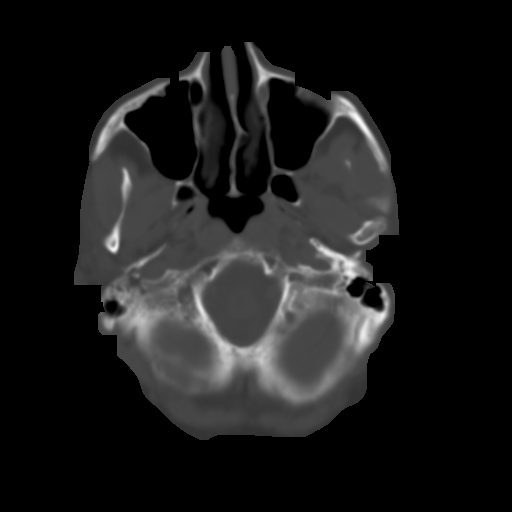
[im 6/30  brain]
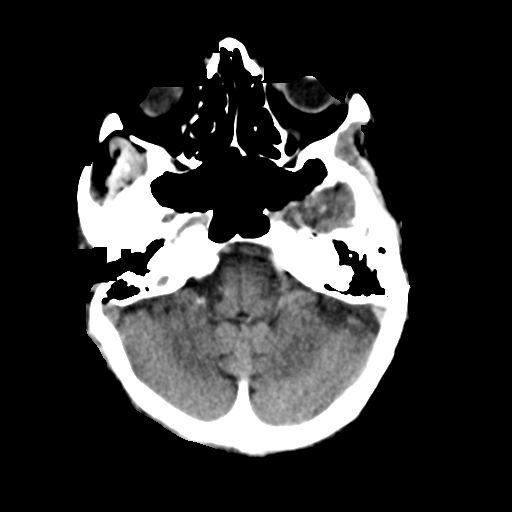
[im 9/30  brain]
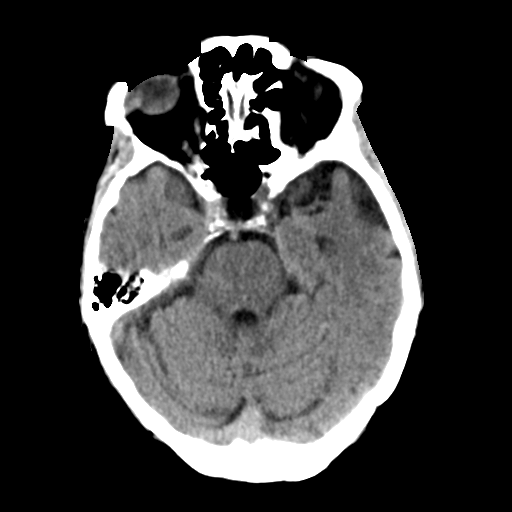
[im 12/30  brain]
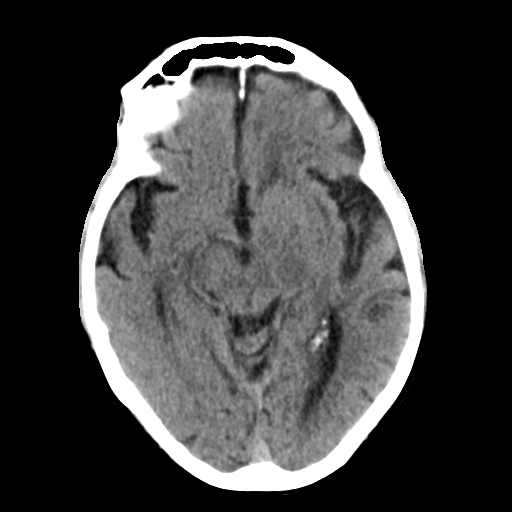
[im 16/30  brain]
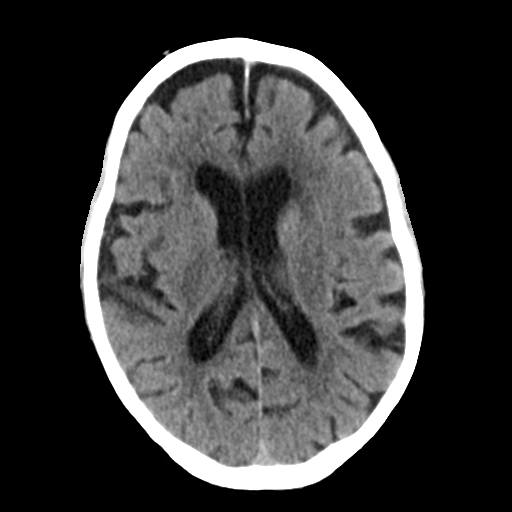
[im 16/30  bone]
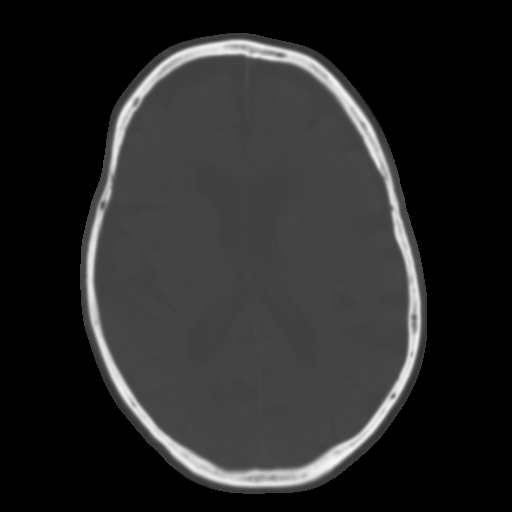
[im 19/30  brain]
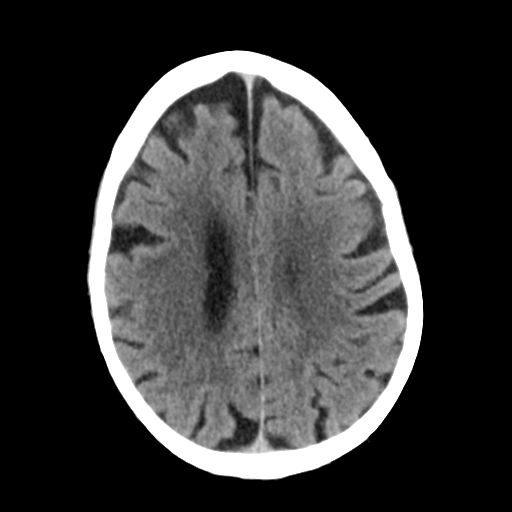
[im 22/30  brain]
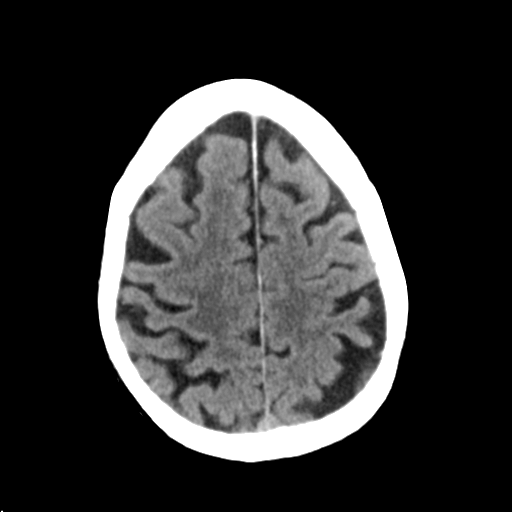
[im 25/30  brain]
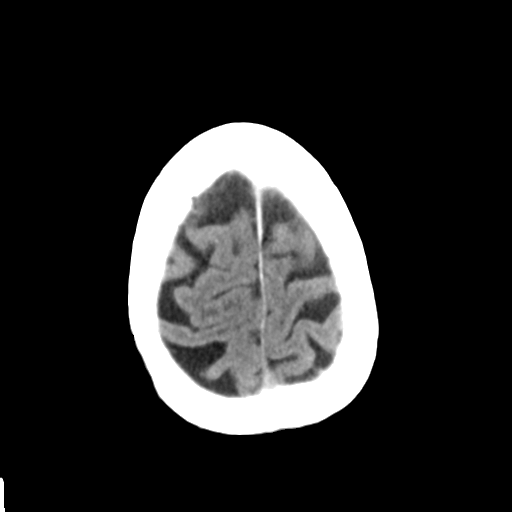
[im 28/30  brain]
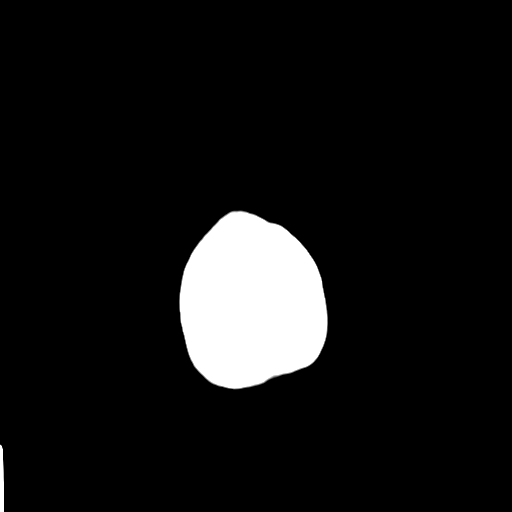
[im 28/30  bone]
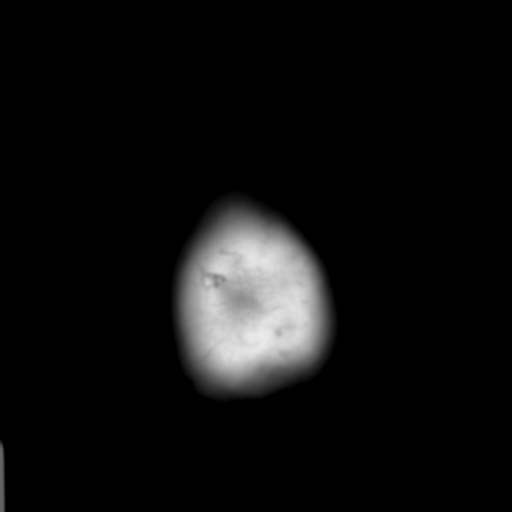

[Series 4: sagittal soft tissue · sagittal · 0.29mm/px · 3 of 52 slices shown]
[im 18/52  brain]
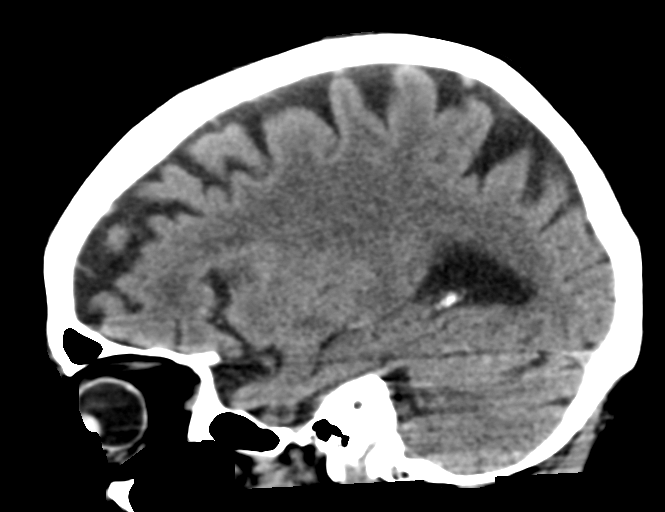
[im 26/52  brain]
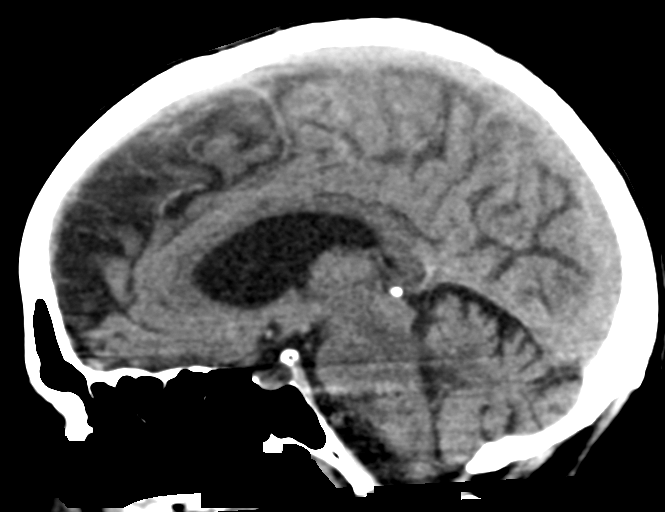
[im 35/52  brain]
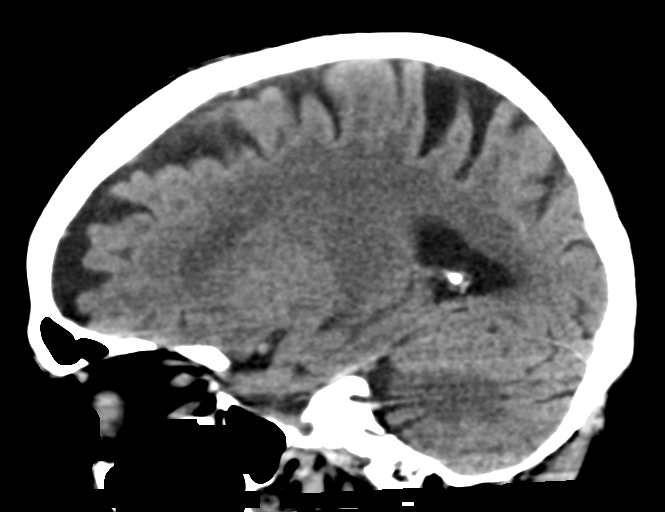

[Series 5: coronal soft tissue · coronal · 0.29mm/px · 3 of 64 slices shown]
[im 22/64  brain]
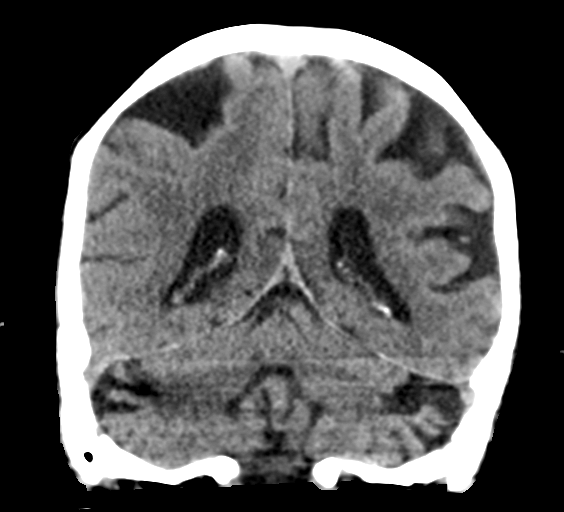
[im 29/64  brain]
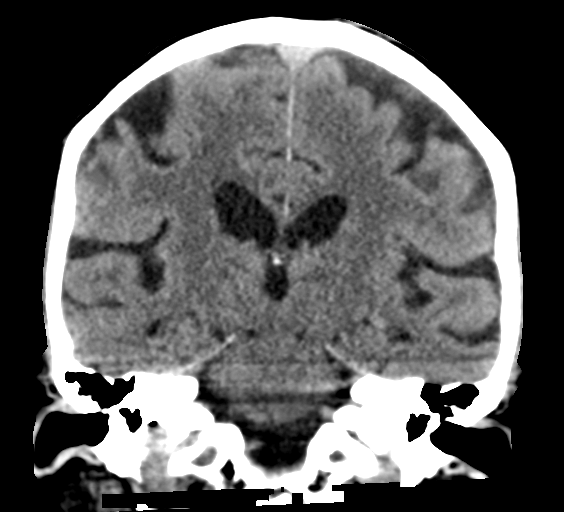
[im 36/64  brain]
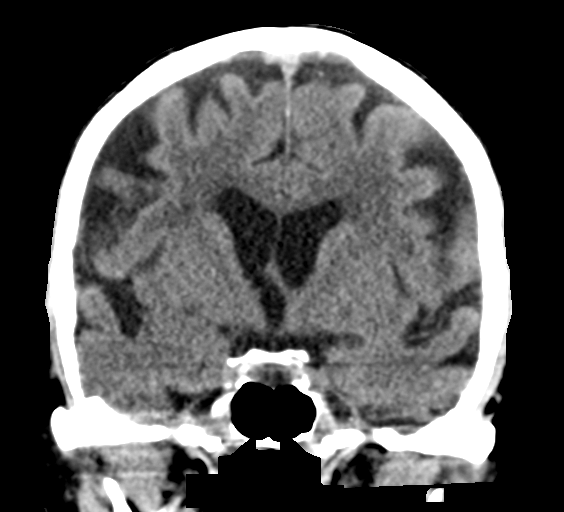

[15 of 47 positions shown; findings below may reference images not displayed]

FINDINGS: Brain: Moderate atrophy. Negative for hydrocephalus. Mild white
matter hypodensity bilaterally appears chronic.

Negative for acute infarct, hemorrhage, mass

Vascular: Negative for hyperdense vessel

Skull: No focal skeletal lesion

Sinuses/Orbits: Paranasal sinuses clear.  Negative orbit

Other: None

ASPECTS (Alberta Stroke Program Early CT Score)

- Ganglionic level infarction (caudate, lentiform nuclei, internal
capsule, insula, M1-M3 cortex): 7

- Supraganglionic infarction (M4-M6 cortex): 3

Total score (0-10 with 10 being normal): 10
IMPRESSION: 1. No acute abnormality
2. ASPECTS is 10
3. Atrophy and chronic microvascular ischemic change
4. These results were called by telephone at the time of
interpretation on 11/15/2019 at [DATE] to provider LARS-BO PONSAING
, who verbally acknowledged these results.

## 2021-06-07 IMAGING — MR MR HEAD W/O CM
9 of 10 series · 39 of 48 positions shown · non-contrast
Comparison: Head CT 11/15/2019 and MRI 09/11/2019

CLINICAL DATA: Speech disturbance with global weakness and
presyncope.

EXAM:
MRI HEAD WITHOUT CONTRAST
TECHNIQUE: Multiplanar, multiecho pulse sequences of the brain and surrounding
structures were obtained without intravenous contrast.

[Series 7: T1 · sagittal · 5.0mm · 0.62mm/px · 2 of 22 slices shown (1 of 2)]
[im 1/22]
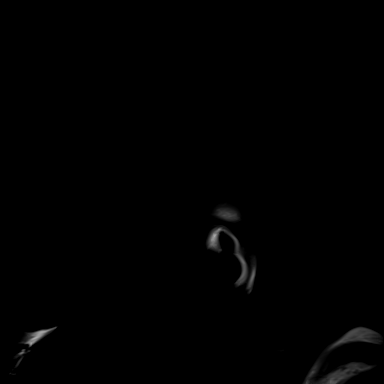
[im 22/22]
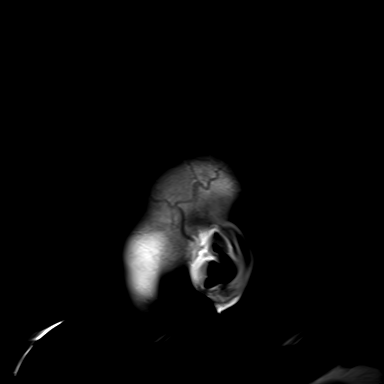

[Series 10: T2 · axial · 5.0mm · 0.53mm/px · z∈[-33,+103]mm · 4 of 25 slices shown (1 of 2)]
[im 1/25]
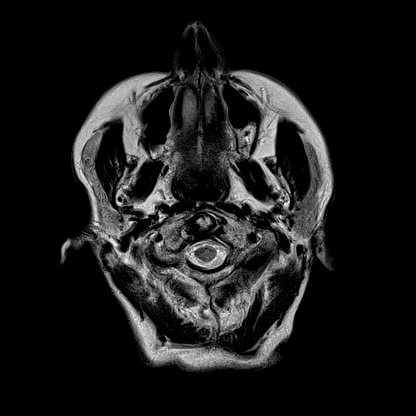
[im 9/25]
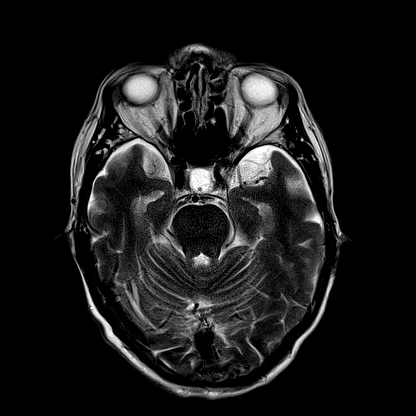
[im 17/25]
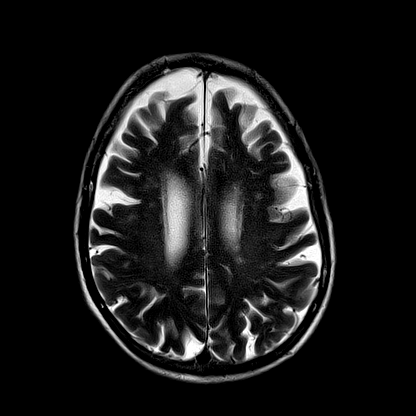
[im 25/25]
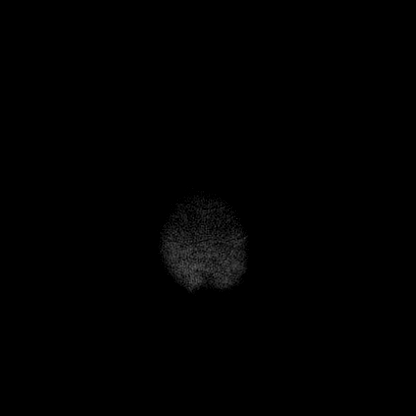

[Series 16: T2 · coronal · 5.0mm · 0.45mm/px · 4 of 31 slices shown (2 of 2)]
[im 1/31]
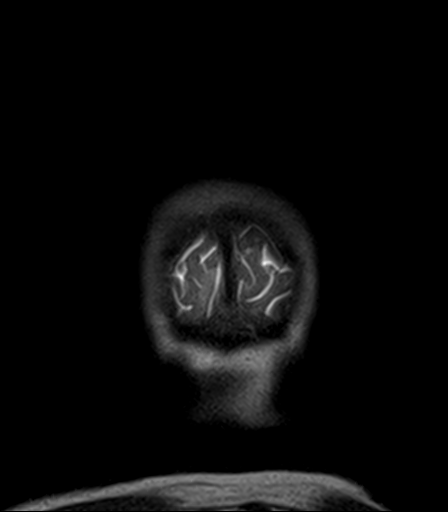
[im 11/31]
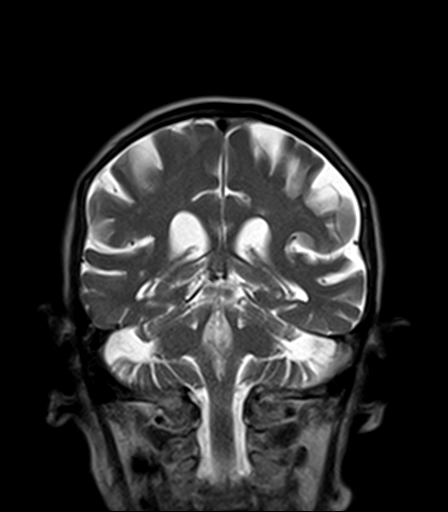
[im 21/31]
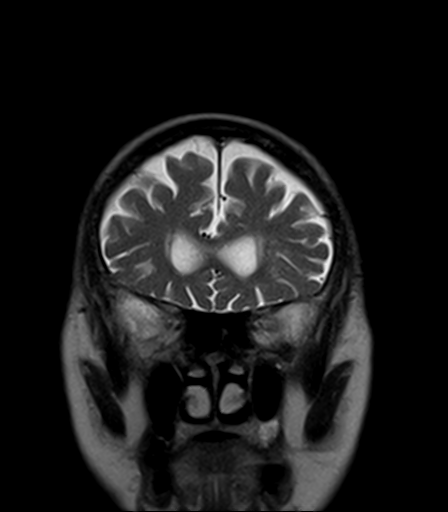
[im 31/31]
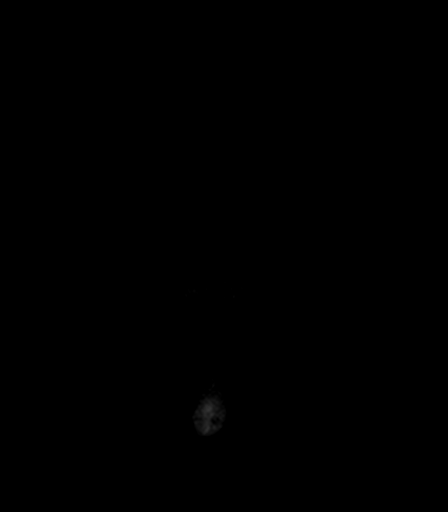

[Series 17: ax dwi_tracew · axial · 3.0mm · 0.60mm/px · z∈[-79,+73]mm · 7 of 48 slices shown]
[im 1/48]
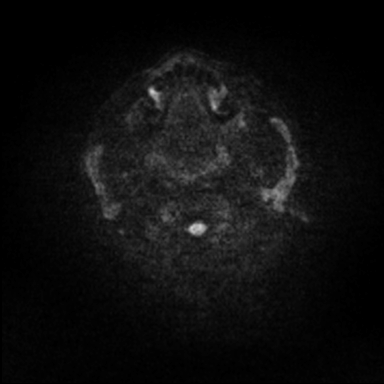
[im 8/48]
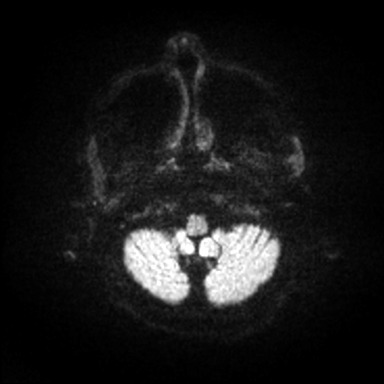
[im 16/48]
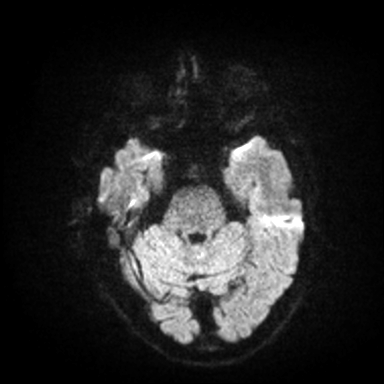
[im 24/48]
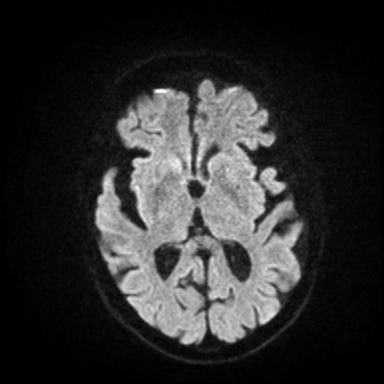
[im 32/48]
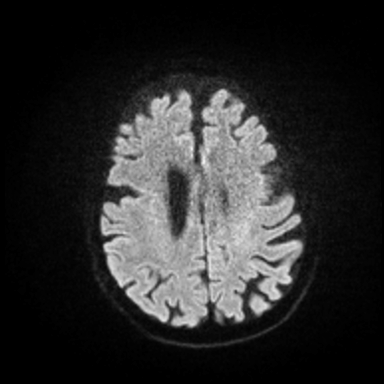
[im 40/48]
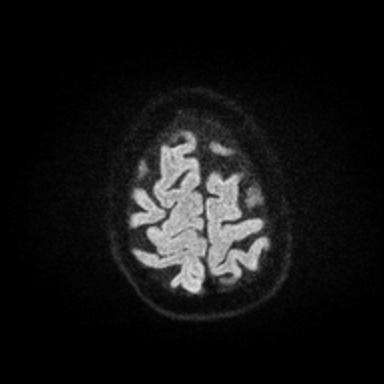
[im 48/48]
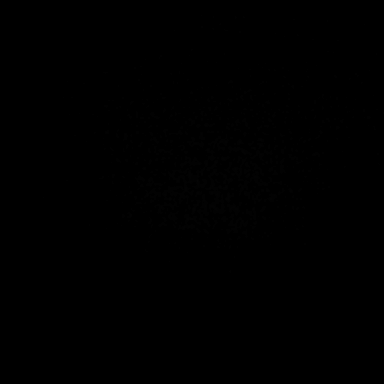

[Series 18: ax dwi_adc · axial · 3.0mm · 0.60mm/px · z∈[-79,+70]mm · 7 of 47 slices shown]
[im 1/47]
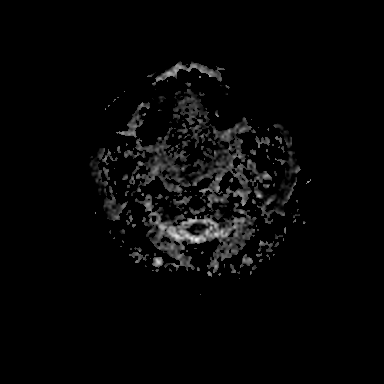
[im 8/47]
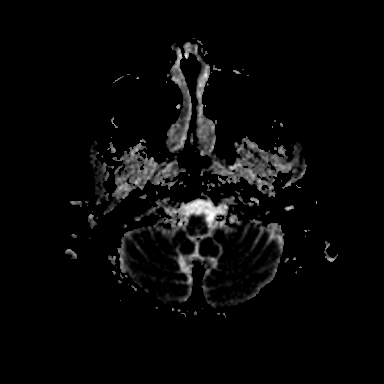
[im 16/47]
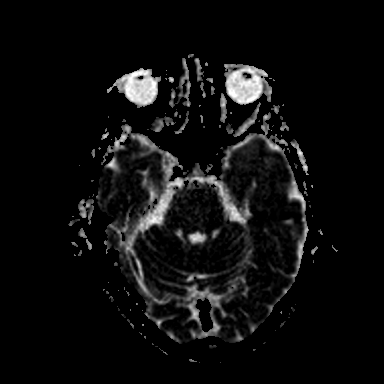
[im 24/47]
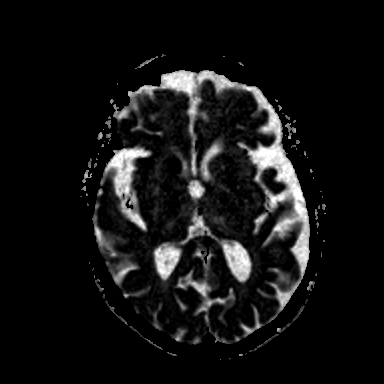
[im 31/47]
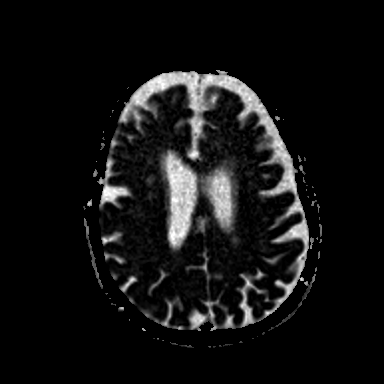
[im 39/47]
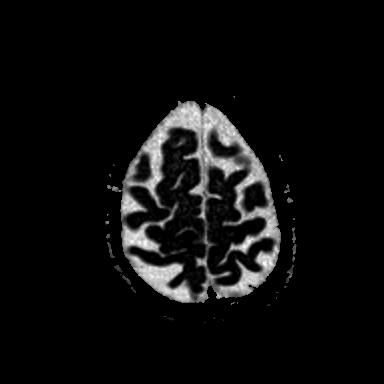
[im 47/47]
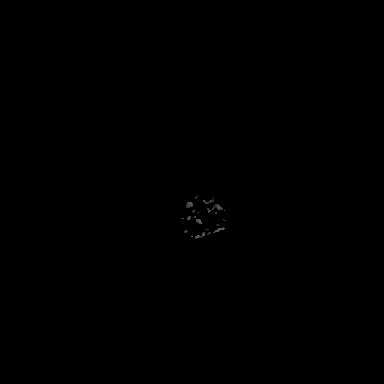

[Series 19: cor dwi_tracew · coronal · 5.0mm · 0.68mm/px · 6 of 40 slices shown]
[im 1/40]
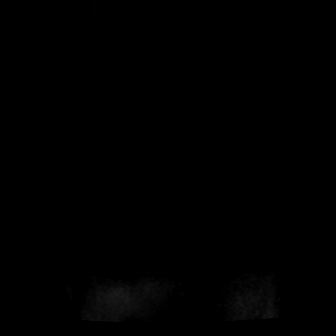
[im 8/40]
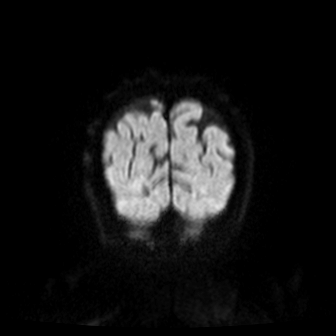
[im 16/40]
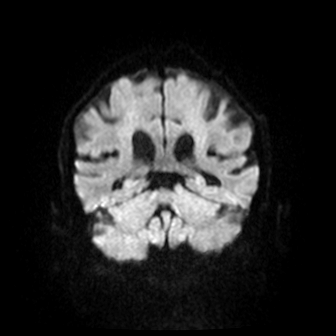
[im 24/40]
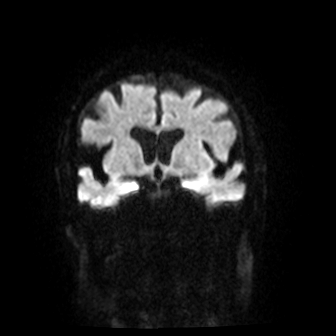
[im 32/40]
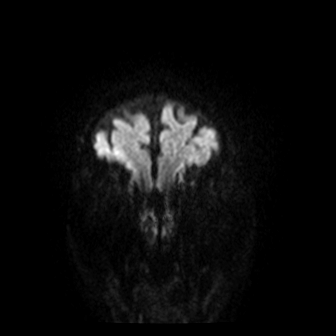
[im 40/40]
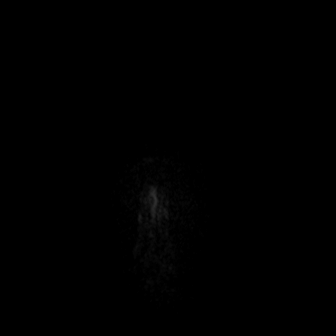

[Series 20: cor dwi_adc · coronal · 5.0mm · 0.68mm/px · 1 of 39 slices shown]
[im 1/39]
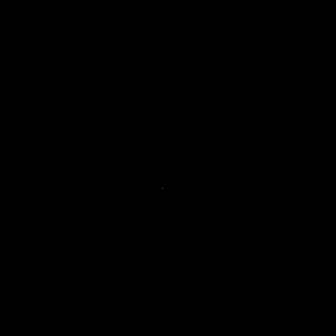

[Series 21: FLAIR · axial · 5.0mm · 1.20mm/px · z∈[-79,+74]mm · 4 of 27 slices shown]
[im 1/27]
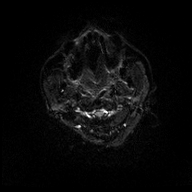
[im 9/27]
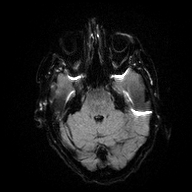
[im 18/27]
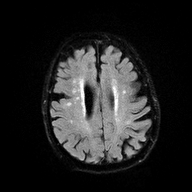
[im 27/27]
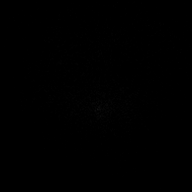

[Series 23: T1 · axial · 5.0mm · 0.90mm/px · z∈[-79,+74]mm · 4 of 27 slices shown (2 of 2)]
[im 1/27]
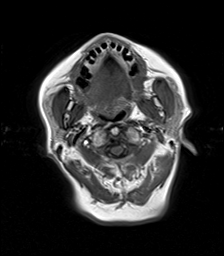
[im 9/27]
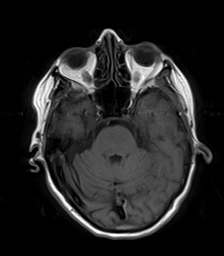
[im 18/27]
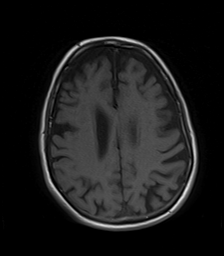
[im 27/27]
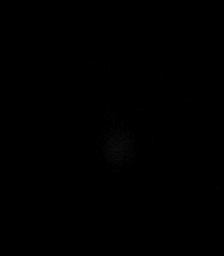

[39 of 48 positions shown; findings below may reference images not displayed]

FINDINGS: Brain: There is no evidence of acute infarct, intracranial
hemorrhage, mass, midline shift, or extra-axial fluid collection. T2
hyperintensities in the cerebral white matter bilaterally are
unchanged from the prior MRI and nonspecific but compatible with
mild chronic small vessel ischemic disease. Generalized cerebral
atrophy is unchanged and evaluated in detail on the previous MRI. A
partially empty sella is again noted.

Vascular: Major intracranial vascular flow voids are preserved.

Skull and upper cervical spine: Unremarkable bone marrow signal.

Sinuses/Orbits: Paranasal sinuses and mastoid air cells are clear.
Unremarkable orbits.

Other: None.
IMPRESSION: 1. No acute intracranial abnormality.
2. Mild chronic small vessel ischemic disease.

## 2021-06-17 DIAGNOSIS — H353122 Nonexudative age-related macular degeneration, left eye, intermediate dry stage: Secondary | ICD-10-CM | POA: Diagnosis not present

## 2021-06-17 DIAGNOSIS — H2701 Aphakia, right eye: Secondary | ICD-10-CM | POA: Diagnosis not present

## 2021-06-17 DIAGNOSIS — H353113 Nonexudative age-related macular degeneration, right eye, advanced atrophic without subfoveal involvement: Secondary | ICD-10-CM | POA: Diagnosis not present

## 2021-06-17 DIAGNOSIS — G43109 Migraine with aura, not intractable, without status migrainosus: Secondary | ICD-10-CM | POA: Diagnosis not present

## 2021-06-17 DIAGNOSIS — H401111 Primary open-angle glaucoma, right eye, mild stage: Secondary | ICD-10-CM | POA: Diagnosis not present

## 2021-06-17 DIAGNOSIS — Z961 Presence of intraocular lens: Secondary | ICD-10-CM | POA: Diagnosis not present

## 2021-06-22 DIAGNOSIS — M79674 Pain in right toe(s): Secondary | ICD-10-CM | POA: Diagnosis not present

## 2021-06-22 DIAGNOSIS — M79675 Pain in left toe(s): Secondary | ICD-10-CM | POA: Diagnosis not present

## 2021-06-22 DIAGNOSIS — B351 Tinea unguium: Secondary | ICD-10-CM | POA: Diagnosis not present

## 2021-06-24 DIAGNOSIS — Z79899 Other long term (current) drug therapy: Secondary | ICD-10-CM | POA: Diagnosis not present

## 2021-07-01 DIAGNOSIS — H353113 Nonexudative age-related macular degeneration, right eye, advanced atrophic without subfoveal involvement: Secondary | ICD-10-CM | POA: Diagnosis not present

## 2021-07-01 DIAGNOSIS — H353122 Nonexudative age-related macular degeneration, left eye, intermediate dry stage: Secondary | ICD-10-CM | POA: Diagnosis not present

## 2021-07-01 DIAGNOSIS — G43109 Migraine with aura, not intractable, without status migrainosus: Secondary | ICD-10-CM | POA: Diagnosis not present

## 2021-07-01 DIAGNOSIS — H401111 Primary open-angle glaucoma, right eye, mild stage: Secondary | ICD-10-CM | POA: Diagnosis not present

## 2021-07-01 DIAGNOSIS — Z961 Presence of intraocular lens: Secondary | ICD-10-CM | POA: Diagnosis not present

## 2021-07-01 DIAGNOSIS — H2701 Aphakia, right eye: Secondary | ICD-10-CM | POA: Diagnosis not present

## 2021-07-02 DIAGNOSIS — J309 Allergic rhinitis, unspecified: Secondary | ICD-10-CM | POA: Diagnosis not present

## 2021-07-02 DIAGNOSIS — G8929 Other chronic pain: Secondary | ICD-10-CM | POA: Diagnosis not present

## 2021-07-02 DIAGNOSIS — D696 Thrombocytopenia, unspecified: Secondary | ICD-10-CM | POA: Diagnosis not present

## 2021-07-02 DIAGNOSIS — Z79899 Other long term (current) drug therapy: Secondary | ICD-10-CM | POA: Diagnosis not present

## 2021-07-02 DIAGNOSIS — M545 Low back pain, unspecified: Secondary | ICD-10-CM | POA: Diagnosis not present

## 2021-07-02 DIAGNOSIS — I10 Essential (primary) hypertension: Secondary | ICD-10-CM | POA: Diagnosis not present

## 2021-07-28 DIAGNOSIS — H6121 Impacted cerumen, right ear: Secondary | ICD-10-CM | POA: Diagnosis not present

## 2021-07-28 DIAGNOSIS — J340 Abscess, furuncle and carbuncle of nose: Secondary | ICD-10-CM | POA: Diagnosis not present

## 2021-08-11 DIAGNOSIS — Z961 Presence of intraocular lens: Secondary | ICD-10-CM | POA: Diagnosis not present

## 2021-08-11 DIAGNOSIS — H353220 Exudative age-related macular degeneration, left eye, stage unspecified: Secondary | ICD-10-CM | POA: Diagnosis not present

## 2021-08-11 DIAGNOSIS — H40051 Ocular hypertension, right eye: Secondary | ICD-10-CM | POA: Diagnosis not present

## 2021-08-11 DIAGNOSIS — H353113 Nonexudative age-related macular degeneration, right eye, advanced atrophic without subfoveal involvement: Secondary | ICD-10-CM | POA: Diagnosis not present

## 2021-08-11 DIAGNOSIS — H2701 Aphakia, right eye: Secondary | ICD-10-CM | POA: Diagnosis not present

## 2021-08-11 DIAGNOSIS — G43109 Migraine with aura, not intractable, without status migrainosus: Secondary | ICD-10-CM | POA: Diagnosis not present

## 2021-08-17 DIAGNOSIS — H353221 Exudative age-related macular degeneration, left eye, with active choroidal neovascularization: Secondary | ICD-10-CM | POA: Diagnosis not present

## 2021-08-17 DIAGNOSIS — H43813 Vitreous degeneration, bilateral: Secondary | ICD-10-CM | POA: Diagnosis not present

## 2021-08-17 DIAGNOSIS — H353114 Nonexudative age-related macular degeneration, right eye, advanced atrophic with subfoveal involvement: Secondary | ICD-10-CM | POA: Diagnosis not present

## 2021-08-17 DIAGNOSIS — H35033 Hypertensive retinopathy, bilateral: Secondary | ICD-10-CM | POA: Diagnosis not present

## 2021-08-19 DIAGNOSIS — H2701 Aphakia, right eye: Secondary | ICD-10-CM | POA: Diagnosis not present

## 2021-08-19 DIAGNOSIS — H353221 Exudative age-related macular degeneration, left eye, with active choroidal neovascularization: Secondary | ICD-10-CM | POA: Diagnosis not present

## 2021-09-16 DIAGNOSIS — H353221 Exudative age-related macular degeneration, left eye, with active choroidal neovascularization: Secondary | ICD-10-CM | POA: Diagnosis not present

## 2021-09-21 DIAGNOSIS — M79675 Pain in left toe(s): Secondary | ICD-10-CM | POA: Diagnosis not present

## 2021-09-21 DIAGNOSIS — M79674 Pain in right toe(s): Secondary | ICD-10-CM | POA: Diagnosis not present

## 2021-09-21 DIAGNOSIS — B351 Tinea unguium: Secondary | ICD-10-CM | POA: Diagnosis not present

## 2021-09-30 DIAGNOSIS — H353220 Exudative age-related macular degeneration, left eye, stage unspecified: Secondary | ICD-10-CM | POA: Diagnosis not present

## 2021-09-30 DIAGNOSIS — G43109 Migraine with aura, not intractable, without status migrainosus: Secondary | ICD-10-CM | POA: Diagnosis not present

## 2021-09-30 DIAGNOSIS — H2701 Aphakia, right eye: Secondary | ICD-10-CM | POA: Diagnosis not present

## 2021-09-30 DIAGNOSIS — H353113 Nonexudative age-related macular degeneration, right eye, advanced atrophic without subfoveal involvement: Secondary | ICD-10-CM | POA: Diagnosis not present

## 2021-09-30 DIAGNOSIS — Z961 Presence of intraocular lens: Secondary | ICD-10-CM | POA: Diagnosis not present

## 2021-09-30 DIAGNOSIS — H40051 Ocular hypertension, right eye: Secondary | ICD-10-CM | POA: Diagnosis not present

## 2021-10-14 DIAGNOSIS — H353221 Exudative age-related macular degeneration, left eye, with active choroidal neovascularization: Secondary | ICD-10-CM | POA: Diagnosis not present

## 2021-10-19 DIAGNOSIS — L608 Other nail disorders: Secondary | ICD-10-CM | POA: Diagnosis not present

## 2021-10-19 DIAGNOSIS — B351 Tinea unguium: Secondary | ICD-10-CM | POA: Diagnosis not present

## 2021-10-26 DIAGNOSIS — G43109 Migraine with aura, not intractable, without status migrainosus: Secondary | ICD-10-CM | POA: Diagnosis not present

## 2021-10-26 DIAGNOSIS — H353222 Exudative age-related macular degeneration, left eye, with inactive choroidal neovascularization: Secondary | ICD-10-CM | POA: Diagnosis not present

## 2021-10-26 DIAGNOSIS — Z961 Presence of intraocular lens: Secondary | ICD-10-CM | POA: Diagnosis not present

## 2021-10-26 DIAGNOSIS — H353113 Nonexudative age-related macular degeneration, right eye, advanced atrophic without subfoveal involvement: Secondary | ICD-10-CM | POA: Diagnosis not present

## 2021-10-26 DIAGNOSIS — H40051 Ocular hypertension, right eye: Secondary | ICD-10-CM | POA: Diagnosis not present

## 2021-10-26 DIAGNOSIS — H2701 Aphakia, right eye: Secondary | ICD-10-CM | POA: Diagnosis not present

## 2021-10-28 DIAGNOSIS — L03032 Cellulitis of left toe: Secondary | ICD-10-CM | POA: Diagnosis not present

## 2021-11-03 DIAGNOSIS — L821 Other seborrheic keratosis: Secondary | ICD-10-CM | POA: Diagnosis not present

## 2021-11-03 DIAGNOSIS — D2272 Melanocytic nevi of left lower limb, including hip: Secondary | ICD-10-CM | POA: Diagnosis not present

## 2021-11-03 DIAGNOSIS — D225 Melanocytic nevi of trunk: Secondary | ICD-10-CM | POA: Diagnosis not present

## 2021-11-03 DIAGNOSIS — D2271 Melanocytic nevi of right lower limb, including hip: Secondary | ICD-10-CM | POA: Diagnosis not present

## 2021-11-03 DIAGNOSIS — Z419 Encounter for procedure for purposes other than remedying health state, unspecified: Secondary | ICD-10-CM | POA: Diagnosis not present

## 2021-11-03 DIAGNOSIS — D2262 Melanocytic nevi of left upper limb, including shoulder: Secondary | ICD-10-CM | POA: Diagnosis not present

## 2021-11-03 DIAGNOSIS — D2261 Melanocytic nevi of right upper limb, including shoulder: Secondary | ICD-10-CM | POA: Diagnosis not present

## 2021-11-25 DIAGNOSIS — E049 Nontoxic goiter, unspecified: Secondary | ICD-10-CM | POA: Diagnosis not present

## 2021-11-25 DIAGNOSIS — M810 Age-related osteoporosis without current pathological fracture: Secondary | ICD-10-CM | POA: Diagnosis not present

## 2021-11-29 DIAGNOSIS — M810 Age-related osteoporosis without current pathological fracture: Secondary | ICD-10-CM | POA: Diagnosis not present

## 2021-11-29 LAB — HM DEXA SCAN

## 2021-12-16 DIAGNOSIS — H353221 Exudative age-related macular degeneration, left eye, with active choroidal neovascularization: Secondary | ICD-10-CM | POA: Diagnosis not present

## 2021-12-21 DIAGNOSIS — M79674 Pain in right toe(s): Secondary | ICD-10-CM | POA: Diagnosis not present

## 2021-12-21 DIAGNOSIS — M79675 Pain in left toe(s): Secondary | ICD-10-CM | POA: Diagnosis not present

## 2021-12-21 DIAGNOSIS — B351 Tinea unguium: Secondary | ICD-10-CM | POA: Diagnosis not present

## 2021-12-24 DIAGNOSIS — I10 Essential (primary) hypertension: Secondary | ICD-10-CM | POA: Diagnosis not present

## 2021-12-24 DIAGNOSIS — Z79899 Other long term (current) drug therapy: Secondary | ICD-10-CM | POA: Diagnosis not present

## 2021-12-30 DIAGNOSIS — H40051 Ocular hypertension, right eye: Secondary | ICD-10-CM | POA: Diagnosis not present

## 2021-12-30 DIAGNOSIS — Z961 Presence of intraocular lens: Secondary | ICD-10-CM | POA: Diagnosis not present

## 2021-12-30 DIAGNOSIS — H2701 Aphakia, right eye: Secondary | ICD-10-CM | POA: Diagnosis not present

## 2021-12-30 DIAGNOSIS — H353113 Nonexudative age-related macular degeneration, right eye, advanced atrophic without subfoveal involvement: Secondary | ICD-10-CM | POA: Diagnosis not present

## 2021-12-30 DIAGNOSIS — G43109 Migraine with aura, not intractable, without status migrainosus: Secondary | ICD-10-CM | POA: Diagnosis not present

## 2021-12-30 DIAGNOSIS — H353222 Exudative age-related macular degeneration, left eye, with inactive choroidal neovascularization: Secondary | ICD-10-CM | POA: Diagnosis not present

## 2021-12-31 DIAGNOSIS — Z79899 Other long term (current) drug therapy: Secondary | ICD-10-CM | POA: Diagnosis not present

## 2021-12-31 DIAGNOSIS — M810 Age-related osteoporosis without current pathological fracture: Secondary | ICD-10-CM | POA: Diagnosis not present

## 2021-12-31 DIAGNOSIS — Z0000 Encounter for general adult medical examination without abnormal findings: Secondary | ICD-10-CM | POA: Diagnosis not present

## 2021-12-31 DIAGNOSIS — Z1331 Encounter for screening for depression: Secondary | ICD-10-CM | POA: Diagnosis not present

## 2022-02-17 DIAGNOSIS — H353221 Exudative age-related macular degeneration, left eye, with active choroidal neovascularization: Secondary | ICD-10-CM | POA: Diagnosis not present

## 2022-03-31 DIAGNOSIS — M79675 Pain in left toe(s): Secondary | ICD-10-CM | POA: Diagnosis not present

## 2022-03-31 DIAGNOSIS — B351 Tinea unguium: Secondary | ICD-10-CM | POA: Diagnosis not present

## 2022-03-31 DIAGNOSIS — M79674 Pain in right toe(s): Secondary | ICD-10-CM | POA: Diagnosis not present

## 2022-03-31 DIAGNOSIS — M2011 Hallux valgus (acquired), right foot: Secondary | ICD-10-CM | POA: Diagnosis not present

## 2022-04-08 DIAGNOSIS — H40051 Ocular hypertension, right eye: Secondary | ICD-10-CM | POA: Diagnosis not present

## 2022-04-08 DIAGNOSIS — H353113 Nonexudative age-related macular degeneration, right eye, advanced atrophic without subfoveal involvement: Secondary | ICD-10-CM | POA: Diagnosis not present

## 2022-04-08 DIAGNOSIS — H2701 Aphakia, right eye: Secondary | ICD-10-CM | POA: Diagnosis not present

## 2022-04-08 DIAGNOSIS — H04123 Dry eye syndrome of bilateral lacrimal glands: Secondary | ICD-10-CM | POA: Diagnosis not present

## 2022-04-08 DIAGNOSIS — H02885 Meibomian gland dysfunction left lower eyelid: Secondary | ICD-10-CM | POA: Diagnosis not present

## 2022-04-08 DIAGNOSIS — H02882 Meibomian gland dysfunction right lower eyelid: Secondary | ICD-10-CM | POA: Diagnosis not present

## 2022-04-08 DIAGNOSIS — Z961 Presence of intraocular lens: Secondary | ICD-10-CM | POA: Diagnosis not present

## 2022-04-08 DIAGNOSIS — H353222 Exudative age-related macular degeneration, left eye, with inactive choroidal neovascularization: Secondary | ICD-10-CM | POA: Diagnosis not present

## 2022-04-08 DIAGNOSIS — G43109 Migraine with aura, not intractable, without status migrainosus: Secondary | ICD-10-CM | POA: Diagnosis not present

## 2022-04-25 DIAGNOSIS — H353221 Exudative age-related macular degeneration, left eye, with active choroidal neovascularization: Secondary | ICD-10-CM | POA: Diagnosis not present

## 2022-06-27 DIAGNOSIS — G43109 Migraine with aura, not intractable, without status migrainosus: Secondary | ICD-10-CM | POA: Diagnosis not present

## 2022-06-27 DIAGNOSIS — H2701 Aphakia, right eye: Secondary | ICD-10-CM | POA: Diagnosis not present

## 2022-06-27 DIAGNOSIS — H02882 Meibomian gland dysfunction right lower eyelid: Secondary | ICD-10-CM | POA: Diagnosis not present

## 2022-06-27 DIAGNOSIS — H04123 Dry eye syndrome of bilateral lacrimal glands: Secondary | ICD-10-CM | POA: Diagnosis not present

## 2022-06-27 DIAGNOSIS — H02885 Meibomian gland dysfunction left lower eyelid: Secondary | ICD-10-CM | POA: Diagnosis not present

## 2022-06-27 DIAGNOSIS — H353222 Exudative age-related macular degeneration, left eye, with inactive choroidal neovascularization: Secondary | ICD-10-CM | POA: Diagnosis not present

## 2022-06-27 DIAGNOSIS — H353113 Nonexudative age-related macular degeneration, right eye, advanced atrophic without subfoveal involvement: Secondary | ICD-10-CM | POA: Diagnosis not present

## 2022-06-27 DIAGNOSIS — Z961 Presence of intraocular lens: Secondary | ICD-10-CM | POA: Diagnosis not present

## 2022-06-27 DIAGNOSIS — H40051 Ocular hypertension, right eye: Secondary | ICD-10-CM | POA: Diagnosis not present

## 2022-06-28 DIAGNOSIS — Z79899 Other long term (current) drug therapy: Secondary | ICD-10-CM | POA: Diagnosis not present

## 2022-06-28 DIAGNOSIS — M810 Age-related osteoporosis without current pathological fracture: Secondary | ICD-10-CM | POA: Diagnosis not present

## 2022-07-04 DIAGNOSIS — H353221 Exudative age-related macular degeneration, left eye, with active choroidal neovascularization: Secondary | ICD-10-CM | POA: Diagnosis not present

## 2022-07-04 DIAGNOSIS — H2701 Aphakia, right eye: Secondary | ICD-10-CM | POA: Diagnosis not present

## 2022-07-04 DIAGNOSIS — H353132 Nonexudative age-related macular degeneration, bilateral, intermediate dry stage: Secondary | ICD-10-CM | POA: Diagnosis not present

## 2022-07-05 DIAGNOSIS — Z79899 Other long term (current) drug therapy: Secondary | ICD-10-CM | POA: Diagnosis not present

## 2022-07-05 DIAGNOSIS — M810 Age-related osteoporosis without current pathological fracture: Secondary | ICD-10-CM | POA: Diagnosis not present

## 2022-07-05 DIAGNOSIS — D696 Thrombocytopenia, unspecified: Secondary | ICD-10-CM | POA: Diagnosis not present

## 2022-07-05 DIAGNOSIS — I10 Essential (primary) hypertension: Secondary | ICD-10-CM | POA: Diagnosis not present

## 2022-07-05 DIAGNOSIS — M545 Low back pain, unspecified: Secondary | ICD-10-CM | POA: Diagnosis not present

## 2022-07-05 DIAGNOSIS — K5909 Other constipation: Secondary | ICD-10-CM | POA: Diagnosis not present

## 2022-07-05 DIAGNOSIS — J309 Allergic rhinitis, unspecified: Secondary | ICD-10-CM | POA: Diagnosis not present

## 2022-07-05 DIAGNOSIS — K644 Residual hemorrhoidal skin tags: Secondary | ICD-10-CM | POA: Diagnosis not present

## 2022-07-05 DIAGNOSIS — G8929 Other chronic pain: Secondary | ICD-10-CM | POA: Diagnosis not present

## 2022-07-08 DIAGNOSIS — H903 Sensorineural hearing loss, bilateral: Secondary | ICD-10-CM | POA: Diagnosis not present

## 2022-07-08 DIAGNOSIS — R040 Epistaxis: Secondary | ICD-10-CM | POA: Diagnosis not present

## 2022-07-08 DIAGNOSIS — H6063 Unspecified chronic otitis externa, bilateral: Secondary | ICD-10-CM | POA: Diagnosis not present

## 2022-07-12 DIAGNOSIS — M79674 Pain in right toe(s): Secondary | ICD-10-CM | POA: Diagnosis not present

## 2022-07-12 DIAGNOSIS — M79675 Pain in left toe(s): Secondary | ICD-10-CM | POA: Diagnosis not present

## 2022-07-12 DIAGNOSIS — B351 Tinea unguium: Secondary | ICD-10-CM | POA: Diagnosis not present

## 2022-09-26 DIAGNOSIS — H353221 Exudative age-related macular degeneration, left eye, with active choroidal neovascularization: Secondary | ICD-10-CM | POA: Diagnosis not present

## 2022-10-04 DIAGNOSIS — H02885 Meibomian gland dysfunction left lower eyelid: Secondary | ICD-10-CM | POA: Diagnosis not present

## 2022-10-04 DIAGNOSIS — G43109 Migraine with aura, not intractable, without status migrainosus: Secondary | ICD-10-CM | POA: Diagnosis not present

## 2022-10-04 DIAGNOSIS — H04123 Dry eye syndrome of bilateral lacrimal glands: Secondary | ICD-10-CM | POA: Diagnosis not present

## 2022-10-04 DIAGNOSIS — H02882 Meibomian gland dysfunction right lower eyelid: Secondary | ICD-10-CM | POA: Diagnosis not present

## 2022-10-04 DIAGNOSIS — Z961 Presence of intraocular lens: Secondary | ICD-10-CM | POA: Diagnosis not present

## 2022-10-04 DIAGNOSIS — H353222 Exudative age-related macular degeneration, left eye, with inactive choroidal neovascularization: Secondary | ICD-10-CM | POA: Diagnosis not present

## 2022-10-04 DIAGNOSIS — H353113 Nonexudative age-related macular degeneration, right eye, advanced atrophic without subfoveal involvement: Secondary | ICD-10-CM | POA: Diagnosis not present

## 2022-10-04 DIAGNOSIS — H40051 Ocular hypertension, right eye: Secondary | ICD-10-CM | POA: Diagnosis not present

## 2022-10-04 DIAGNOSIS — H2701 Aphakia, right eye: Secondary | ICD-10-CM | POA: Diagnosis not present

## 2022-10-20 DIAGNOSIS — M79675 Pain in left toe(s): Secondary | ICD-10-CM | POA: Diagnosis not present

## 2022-10-20 DIAGNOSIS — M79674 Pain in right toe(s): Secondary | ICD-10-CM | POA: Diagnosis not present

## 2022-10-20 DIAGNOSIS — B351 Tinea unguium: Secondary | ICD-10-CM | POA: Diagnosis not present

## 2022-11-11 DIAGNOSIS — D225 Melanocytic nevi of trunk: Secondary | ICD-10-CM | POA: Diagnosis not present

## 2022-11-11 DIAGNOSIS — D2261 Melanocytic nevi of right upper limb, including shoulder: Secondary | ICD-10-CM | POA: Diagnosis not present

## 2022-11-11 DIAGNOSIS — L821 Other seborrheic keratosis: Secondary | ICD-10-CM | POA: Diagnosis not present

## 2022-11-11 DIAGNOSIS — D2262 Melanocytic nevi of left upper limb, including shoulder: Secondary | ICD-10-CM | POA: Diagnosis not present

## 2022-11-11 DIAGNOSIS — D2271 Melanocytic nevi of right lower limb, including hip: Secondary | ICD-10-CM | POA: Diagnosis not present

## 2022-11-11 DIAGNOSIS — D2272 Melanocytic nevi of left lower limb, including hip: Secondary | ICD-10-CM | POA: Diagnosis not present

## 2022-12-23 DIAGNOSIS — H353132 Nonexudative age-related macular degeneration, bilateral, intermediate dry stage: Secondary | ICD-10-CM | POA: Diagnosis not present

## 2022-12-27 DIAGNOSIS — D696 Thrombocytopenia, unspecified: Secondary | ICD-10-CM | POA: Diagnosis not present

## 2022-12-27 DIAGNOSIS — Z79899 Other long term (current) drug therapy: Secondary | ICD-10-CM | POA: Diagnosis not present

## 2022-12-27 DIAGNOSIS — I10 Essential (primary) hypertension: Secondary | ICD-10-CM | POA: Diagnosis not present

## 2023-01-02 DIAGNOSIS — H353222 Exudative age-related macular degeneration, left eye, with inactive choroidal neovascularization: Secondary | ICD-10-CM | POA: Diagnosis not present

## 2023-01-02 DIAGNOSIS — Z961 Presence of intraocular lens: Secondary | ICD-10-CM | POA: Diagnosis not present

## 2023-01-02 DIAGNOSIS — H04123 Dry eye syndrome of bilateral lacrimal glands: Secondary | ICD-10-CM | POA: Diagnosis not present

## 2023-01-02 DIAGNOSIS — H02885 Meibomian gland dysfunction left lower eyelid: Secondary | ICD-10-CM | POA: Diagnosis not present

## 2023-01-02 DIAGNOSIS — H40051 Ocular hypertension, right eye: Secondary | ICD-10-CM | POA: Diagnosis not present

## 2023-01-02 DIAGNOSIS — H2701 Aphakia, right eye: Secondary | ICD-10-CM | POA: Diagnosis not present

## 2023-01-02 DIAGNOSIS — H02882 Meibomian gland dysfunction right lower eyelid: Secondary | ICD-10-CM | POA: Diagnosis not present

## 2023-01-02 DIAGNOSIS — H353113 Nonexudative age-related macular degeneration, right eye, advanced atrophic without subfoveal involvement: Secondary | ICD-10-CM | POA: Diagnosis not present

## 2023-01-02 DIAGNOSIS — G43109 Migraine with aura, not intractable, without status migrainosus: Secondary | ICD-10-CM | POA: Diagnosis not present

## 2023-01-03 DIAGNOSIS — Z0000 Encounter for general adult medical examination without abnormal findings: Secondary | ICD-10-CM | POA: Diagnosis not present

## 2023-01-03 DIAGNOSIS — Z1331 Encounter for screening for depression: Secondary | ICD-10-CM | POA: Diagnosis not present

## 2023-01-03 DIAGNOSIS — E871 Hypo-osmolality and hyponatremia: Secondary | ICD-10-CM | POA: Diagnosis not present

## 2023-01-03 DIAGNOSIS — Z79899 Other long term (current) drug therapy: Secondary | ICD-10-CM | POA: Diagnosis not present

## 2023-01-03 DIAGNOSIS — I10 Essential (primary) hypertension: Secondary | ICD-10-CM | POA: Diagnosis not present

## 2023-01-24 DIAGNOSIS — G43109 Migraine with aura, not intractable, without status migrainosus: Secondary | ICD-10-CM | POA: Diagnosis not present

## 2023-01-24 DIAGNOSIS — H2701 Aphakia, right eye: Secondary | ICD-10-CM | POA: Diagnosis not present

## 2023-01-24 DIAGNOSIS — H02885 Meibomian gland dysfunction left lower eyelid: Secondary | ICD-10-CM | POA: Diagnosis not present

## 2023-01-24 DIAGNOSIS — H02882 Meibomian gland dysfunction right lower eyelid: Secondary | ICD-10-CM | POA: Diagnosis not present

## 2023-01-24 DIAGNOSIS — H40051 Ocular hypertension, right eye: Secondary | ICD-10-CM | POA: Diagnosis not present

## 2023-01-24 DIAGNOSIS — H353113 Nonexudative age-related macular degeneration, right eye, advanced atrophic without subfoveal involvement: Secondary | ICD-10-CM | POA: Diagnosis not present

## 2023-01-24 DIAGNOSIS — H04123 Dry eye syndrome of bilateral lacrimal glands: Secondary | ICD-10-CM | POA: Diagnosis not present

## 2023-01-24 DIAGNOSIS — H353222 Exudative age-related macular degeneration, left eye, with inactive choroidal neovascularization: Secondary | ICD-10-CM | POA: Diagnosis not present

## 2023-01-24 DIAGNOSIS — Z961 Presence of intraocular lens: Secondary | ICD-10-CM | POA: Diagnosis not present

## 2023-01-24 DIAGNOSIS — H5712 Ocular pain, left eye: Secondary | ICD-10-CM | POA: Diagnosis not present

## 2023-01-25 DIAGNOSIS — E049 Nontoxic goiter, unspecified: Secondary | ICD-10-CM | POA: Diagnosis not present

## 2023-01-25 DIAGNOSIS — M810 Age-related osteoporosis without current pathological fracture: Secondary | ICD-10-CM | POA: Diagnosis not present

## 2023-01-26 DIAGNOSIS — B351 Tinea unguium: Secondary | ICD-10-CM | POA: Diagnosis not present

## 2023-01-26 DIAGNOSIS — M79675 Pain in left toe(s): Secondary | ICD-10-CM | POA: Diagnosis not present

## 2023-01-26 DIAGNOSIS — M79674 Pain in right toe(s): Secondary | ICD-10-CM | POA: Diagnosis not present

## 2023-02-09 DIAGNOSIS — M810 Age-related osteoporosis without current pathological fracture: Secondary | ICD-10-CM | POA: Diagnosis not present

## 2023-03-22 DIAGNOSIS — H353132 Nonexudative age-related macular degeneration, bilateral, intermediate dry stage: Secondary | ICD-10-CM | POA: Diagnosis not present

## 2023-04-10 DIAGNOSIS — H2701 Aphakia, right eye: Secondary | ICD-10-CM | POA: Diagnosis not present

## 2023-04-10 DIAGNOSIS — H02882 Meibomian gland dysfunction right lower eyelid: Secondary | ICD-10-CM | POA: Diagnosis not present

## 2023-04-10 DIAGNOSIS — Z961 Presence of intraocular lens: Secondary | ICD-10-CM | POA: Diagnosis not present

## 2023-04-10 DIAGNOSIS — H353222 Exudative age-related macular degeneration, left eye, with inactive choroidal neovascularization: Secondary | ICD-10-CM | POA: Diagnosis not present

## 2023-04-10 DIAGNOSIS — H04123 Dry eye syndrome of bilateral lacrimal glands: Secondary | ICD-10-CM | POA: Diagnosis not present

## 2023-04-10 DIAGNOSIS — H40051 Ocular hypertension, right eye: Secondary | ICD-10-CM | POA: Diagnosis not present

## 2023-04-10 DIAGNOSIS — H5712 Ocular pain, left eye: Secondary | ICD-10-CM | POA: Diagnosis not present

## 2023-04-10 DIAGNOSIS — H353113 Nonexudative age-related macular degeneration, right eye, advanced atrophic without subfoveal involvement: Secondary | ICD-10-CM | POA: Diagnosis not present

## 2023-04-10 DIAGNOSIS — H02885 Meibomian gland dysfunction left lower eyelid: Secondary | ICD-10-CM | POA: Diagnosis not present

## 2023-04-10 DIAGNOSIS — G43109 Migraine with aura, not intractable, without status migrainosus: Secondary | ICD-10-CM | POA: Diagnosis not present

## 2023-05-04 DIAGNOSIS — M79674 Pain in right toe(s): Secondary | ICD-10-CM | POA: Diagnosis not present

## 2023-05-04 DIAGNOSIS — M79675 Pain in left toe(s): Secondary | ICD-10-CM | POA: Diagnosis not present

## 2023-05-04 DIAGNOSIS — B351 Tinea unguium: Secondary | ICD-10-CM | POA: Diagnosis not present

## 2023-06-06 ENCOUNTER — Encounter: Payer: Self-pay | Admitting: Family Medicine

## 2023-06-06 ENCOUNTER — Ambulatory Visit: Payer: PPO | Admitting: Family Medicine

## 2023-06-06 VITALS — BP 170/92 | HR 85 | Resp 16 | Ht <= 58 in | Wt 110.2 lb

## 2023-06-06 DIAGNOSIS — E871 Hypo-osmolality and hyponatremia: Secondary | ICD-10-CM | POA: Insufficient documentation

## 2023-06-06 DIAGNOSIS — K219 Gastro-esophageal reflux disease without esophagitis: Secondary | ICD-10-CM | POA: Diagnosis not present

## 2023-06-06 DIAGNOSIS — M810 Age-related osteoporosis without current pathological fracture: Secondary | ICD-10-CM | POA: Diagnosis not present

## 2023-06-06 DIAGNOSIS — I10 Essential (primary) hypertension: Secondary | ICD-10-CM | POA: Diagnosis not present

## 2023-06-06 DIAGNOSIS — Z7689 Persons encountering health services in other specified circumstances: Secondary | ICD-10-CM

## 2023-06-06 DIAGNOSIS — F411 Generalized anxiety disorder: Secondary | ICD-10-CM

## 2023-06-06 DIAGNOSIS — J449 Chronic obstructive pulmonary disease, unspecified: Secondary | ICD-10-CM | POA: Insufficient documentation

## 2023-06-06 DIAGNOSIS — H53009 Unspecified amblyopia, unspecified eye: Secondary | ICD-10-CM | POA: Insufficient documentation

## 2023-06-06 DIAGNOSIS — R0789 Other chest pain: Secondary | ICD-10-CM | POA: Insufficient documentation

## 2023-06-06 DIAGNOSIS — E049 Nontoxic goiter, unspecified: Secondary | ICD-10-CM | POA: Insufficient documentation

## 2023-06-06 NOTE — Assessment & Plan Note (Addendum)
 Did not take BP medications this morning.  Long-standing hypertension managed with hydrochlorothiazide  12.5 mg and atenolol  25 mg daily. Recent recommendation to add losartan  due to slightly low sodium chloride  levels. Blood pressure logs reviewed, reflecting current medication regimen. Discussed the importance of maintaining blood pressure control and potential risks of untreated hypertension. Patient prefers to continue current medications and add losartan . - Start losartan  tomorrow - Continue hydrochlorothiazide  12.5 mg and atenolol  25 mg daily - Monitor blood pressure at home - Follow up in two weeks for blood pressure check and medication review

## 2023-06-06 NOTE — Patient Instructions (Addendum)
 Please take:  Losartan 25 mg daily Omeprazole 20 mg daily

## 2023-06-06 NOTE — Progress Notes (Signed)
 New patient visit   Patient: Katie Hanna   DOB: [birth year]   89 y.o. Female  MRN: [id number] Visit Date: 06/06/2023  Today's healthcare provider: LAURAINE LOISE BUOY, DO   Chief Complaint  Patient presents with   Establish Care   Subjective    Katie Hanna is a 89 y.o. female who presents today as a new patient to establish care.  HPI  Home BP: 130s-160s/60s-80s with one SBP reading of 127. Sees endo - Last TSH 1.247 11/25/2021  Providers: Dr. Laurice (Optho) Dr. Cherilyn (Endo) Dr. Milissa (ENT) Dr. Maree (Neurology) Dr. Aundria (GI) Dr. Ashley (Podiatry)     The patient, with a history of hypertension, osteoporosis, and anxiety, presents for a consultation regarding new medications prescribed by her previous physician. The patient has been adhering to a regimen of hydrochlorothiazide  12.5mg  and atenolol  25mg  daily for hypertension management. However, the patient has not yet started on the newly prescribed losartan  and omeprazole, seeking a second opinion first.  She has occasionally taken 1/2 tablet of her newly prescribed lorazepam , though she still has most of her 20 tablets left in her bottle.  The patient reports occasional anxiety, for which lorazepam  is taken sparingly, as noted above. The patient expresses a philosophical opposition to psychiatric medications, associating them with a family member's addiction.  She also notes that she tried samples of Paxil 20 or so years ago and that it made her feel like a zombie.  The patient also reports a discomfort in the chest, radiating to the left armpit, particularly after overeating. This discomfort is described as a burning sensation. The patient manages this discomfort by taking a small piece of Tums, which provides relief. This discomfort occurs approximately twice a week and is sometimes bothersome.  The patient has a history of osteoporosis, for which she takes 1500mg  of calcium  citrate daily in three divided doses. The  patient also receives an injection for bone stabilization, the name of which she cannot recall, either once or twice a year. The patient also reports a goiter, which has been deemed not suitable for surgery due to its size and location. The patient experiences occasional difficulty swallowing, which she attributes to the goiter.  The patient also reports a history of hypertension, managed with hydrochlorothiazide  and atenolol . The patient has been monitoring her blood pressure at home, noting that the readings reflect the current medication regimen and not the newly prescribed losartan . The patient expresses a desire to potentially discontinue atenolol  in the future, depending on the effects of the losartan .  The patient has a history of falls due to osteoporosis but reports no fractures from these incidents. The patient used to exercise in a home pool but has discontinued this due to fear of falling and breaking a hip. The patient now walks around the house with a walker for exercise.  The patient has a known allergy to gluten and maintains a low-gluten diet. The patient has had to modify her diet in recent years, noting that raw salads no longer agree with her. The patient now lightly steams greens and maintains a balanced diet.  The patient has a skin condition, which is monitored by a dermatologist annually. The patient notes a new skin lesion, which she plans to discuss with her dermatologist if it does not resolve in a few weeks.   Past Medical History:  Diagnosis Date   Allergy    unknown   Anxiety    Cancer (HCC) 2007   rfa  for rt. kidney tumor   Cataract Dec. 2022   Hypertension    Osteoporosis    Thyroid  disease unknown   goiter   Past Surgical History:  Procedure Laterality Date   BACK SURGERY     EYE MUSCLE SURGERY     SPINE SURGERY     vertebroplasticy   TONSILLECTOMY     TUBAL LIGATION     Family Status  Relation Name Status   Brother Joe (Not Specified)   Mother Zada ONEIDA Minerva (Not Specified)  No partnership data on file   Family History  Problem Relation Age of Onset   Hypertension Brother    Anxiety disorder Mother    Cancer Mother    Social History   Socioeconomic History   Marital status: Married    Spouse name: Not on file   Number of children: Not on file   Years of education: Not on file   Highest education level: Master's degree (e.g., MA, MS, MEng, MEd, MSW, MBA)  Occupational History   Not on file  Tobacco Use   Smoking status: Never   Smokeless tobacco: Never  Vaping Use   Vaping status: Never Used  Substance and Sexual Activity   Alcohol use: Never   Drug use: Never   Sexual activity: Not Currently    Birth control/protection: Surgical  Other Topics Concern   Not on file  Social History Narrative   Not on file   Social Drivers of Health   Financial Resource Strain: Low Risk  (06/05/2023)   Overall Financial Resource Strain (CARDIA)    Difficulty of Paying Living Expenses: Not very hard  Food Insecurity: No Food Insecurity (06/05/2023)   Hunger Vital Sign    Worried About Running Out of Food in the Last Year: Never true    Ran Out of Food in the Last Year: Never true  Transportation Needs: No Transportation Needs (06/05/2023)   PRAPARE - Administrator, Civil Service (Medical): No    Lack of Transportation (Non-Medical): No  Physical Activity: Unknown (06/05/2023)   Exercise Vital Sign    Days of Exercise per Week: 0 days    Minutes of Exercise per Session: Not on file  Stress: Stress Concern Present (06/05/2023)   [employer] of Occupational Health - Occupational Stress Questionnaire    Feeling of Stress : To some extent  Social Connections: Unknown (06/05/2023)   Social Connection and Isolation Panel [NHANES]    Frequency of Communication with Friends and Family: Once a week    Frequency of Social Gatherings with Friends and Family: Once a week    Attends Religious Services: Patient declined    Automotive Engineer or Organizations: Yes    Attends Banker Meetings: Never    Marital Status: Married   Outpatient Medications Prior to Visit  Medication Sig   atenolol  (TENORMIN ) 25 MG tablet Take 25 mg by mouth daily.   Calcium  Citrate 250 MG TABS Take 500 mg by mouth 3 (three) times daily.   hydrochlorothiazide  (HYDRODIURIL ) 12.5 MG tablet Take 12.5 mg by mouth daily.   LORazepam  (ATIVAN ) 0.5 MG tablet Take 0.5 mg by mouth 2 (two) times daily as needed.   ascorbic acid  (VITAMIN C) 1000 MG tablet Take 1,000 mg by mouth daily. (Patient not taking: Reported on 06/06/2023)   citalopram (CELEXA) 10 MG tablet Take 1 tablet by mouth daily. (Patient not taking: Reported on 06/06/2023)   Multiple Vitamin (MULTI-VITAMIN) tablet Take 1  tablet by mouth daily. (Patient not taking: Reported on 06/06/2023)   omeprazole (PRILOSEC) 20 MG capsule Take 20 mg by mouth daily. (Patient not taking: Reported on 06/06/2023)   polyethylene glycol powder (GLYCOLAX/MIRALAX) 17 GM/SCOOP powder Take by mouth. (Patient not taking: Reported on 06/06/2023)   [DISCONTINUED] aspirin  81 MG EC tablet Take 1 tablet (81 mg total) by mouth daily. Swallow whole.   [DISCONTINUED] atorvastatin  (LIPITOR) 40 MG tablet Take 1 tablet (40 mg total) by mouth daily.   [DISCONTINUED] azelastine (ASTELIN) 0.1 % nasal spray Place into the nose.   [DISCONTINUED] Calcium  Citrate 250 MG TABS Take 500 mg by mouth in the morning, at noon, and at bedtime. (Patient not taking: Reported on 06/06/2023)   [DISCONTINUED] Cholecalciferol  10 MCG (400 UNIT) CHEW Chew 400 Units by mouth in the morning and at bedtime.   No facility-administered medications prior to visit.   Allergies  Allergen Reactions   Omnipaque [Iohexol] Itching    Pt states she has an allergy to IV contrast.  Severe itching.  She states  it was like many, many insects trying to get out from under my skin.  She does not want to receive IV contrast. J Bohm 03/24/15.   Alendronate     Gluten Meal Other (See Comments)    Unknown Unknown    Hydrocodone-Acetaminophen     Other Other (See Comments)    Dairy Products have unknown reaction Dairy Products have unknown reaction    Wheat Other (See Comments)    Unknown Unknown     Immunization History  Administered Date(s) Administered   Influenza, High Dose Seasonal PF 02/26/2015    Health Maintenance  Topic Date Due   Pneumonia Vaccine 65+ Years old (1 of 2 - PCV) Never done   DTaP/Tdap/Td (1 - Tdap) Never done   Zoster Vaccines- Shingrix (1 of 2) Never done   DEXA SCAN  Never done   COVID-19 Vaccine (2 - Pfizer risk series) 07/25/2019   INFLUENZA VACCINE  12/29/2022   Medicare Annual Wellness (AWV)  01/03/2024   HPV VACCINES  Aged Out    Patient Care Team: Donzella Lauraine SAILOR, DO as PCP - General (Family Medicine)       Objective    BP (!) 170/92   Pulse 85   Resp 16   Ht 4' 10 (1.473 m)   Wt 110 lb 3.2 oz (50 kg)   SpO2 100%   BMI 23.03 kg/m     Physical Exam Constitutional:      Appearance: Normal appearance.  HENT:     Head: Normocephalic and atraumatic.  Eyes:     General: No scleral icterus.    Extraocular Movements: Extraocular movements intact.     Conjunctiva/sclera: Conjunctivae normal.  Cardiovascular:     Rate and Rhythm: Normal rate and regular rhythm.     Pulses: Normal pulses.     Heart sounds: Normal heart sounds.  Pulmonary:     Effort: Pulmonary effort is normal. No respiratory distress.     Breath sounds: Normal breath sounds.  Abdominal:     General: Bowel sounds are normal. There is no distension.     Palpations: Abdomen is soft. There is no mass.     Tenderness: There is no abdominal tenderness. There is no guarding.  Musculoskeletal:     Right lower leg: No edema.     Left lower leg: No edema.     Comments: Uses walker at baseline  Skin:    General: Skin is  warm and dry.  Neurological:     Mental Status: She is alert and oriented to person, place, and  time. Mental status is at baseline.  Psychiatric:        Mood and Affect: Mood normal.        Behavior: Behavior normal.     uses walker   Depression Screen    06/06/2023   10:51 AM  PHQ 2/9 Scores  PHQ - 2 Score 0  PHQ- 9 Score 1   Results for orders placed or performed in visit on 06/06/23  Comprehensive metabolic panel  Result Value Ref Range   Glucose 92 70 - 99 mg/dL   BUN 10 10 - 36 mg/dL   Creatinine, Ser 9.31 0.57 - 1.00 mg/dL   eGFR 82 >40 fO/fpw/8.26   BUN/Creatinine Ratio 15 12 - 28   Sodium 136 134 - 144 mmol/L   Potassium 3.8 3.5 - 5.2 mmol/L   Chloride 95 (L) 96 - 106 mmol/L   CO2 25 20 - 29 mmol/L   Calcium  9.8 8.7 - 10.3 mg/dL   Total Protein 6.9 6.0 - 8.5 g/dL   Albumin 4.7 (H) 3.6 - 4.6 g/dL   Globulin, Total 2.2 1.5 - 4.5 g/dL   Bilirubin Total 0.6 0.0 - 1.2 mg/dL   Alkaline Phosphatase 74 44 - 121 IU/L   AST 36 0 - 40 IU/L   ALT 22 0 - 32 IU/L  Lipid panel  Result Value Ref Range   Cholesterol, Total 199 100 - 199 mg/dL   Triglycerides 57 0 - 149 mg/dL   HDL 84 >60 mg/dL   VLDL Cholesterol Cal 11 5 - 40 mg/dL   LDL Chol Calc (NIH) 895 (H) 0 - 99 mg/dL   Chol/HDL Ratio 2.4 0.0 - 4.4 ratio  TSH Rfx on Abnormal to Free T4  Result Value Ref Range   TSH 1.890 0.450 - 4.500 uIU/mL    Assessment & Plan     Essential (primary) hypertension Assessment & Plan: Did not take BP medications this morning.  Long-standing hypertension managed with hydrochlorothiazide  12.5 mg and atenolol  25 mg daily. Recent recommendation to add losartan  due to slightly low sodium chloride  levels. Blood pressure logs reviewed, reflecting current medication regimen. Discussed the importance of maintaining blood pressure control and potential risks of untreated hypertension. Patient prefers to continue current medications and add losartan . - Start losartan  tomorrow - Continue hydrochlorothiazide  12.5 mg and atenolol  25 mg daily - Monitor blood pressure at home - Follow up in  two weeks for blood pressure check and medication review  Orders: -     Comprehensive metabolic panel -     Lipid panel  Establishing care with new doctor, encounter for Assessment & Plan: Routine health maintenance discussed, including dermatology follow-up for skin lesions and annual endocrinology visits for osteoporosis management. - Schedule dermatology follow-up if skin lesion persists - Continue annual endocrinology visits   Goiter Assessment & Plan: Will check TSH today.  Orders: -     TSH Rfx on Abnormal to Free T4  Age related osteoporosis, unspecified pathological fracture presence Assessment & Plan: Patient notes that she was 5'2 in her 44s; she is 4'10 now. DEXA scan 11/29/2021 left femoral neck T-score was -4.2, worsened from T-score of -3.6 on 03/27/2019, with a statistically significant change of -15.8%.   - Patient follows with endocrinology; will defer to their management. -Continue calcium  and vitamin D supplementation. -History of 11 total fractures to date, per endocrinology note 01/25/2023 -  Previously received Reclast ; now on Prolia . Last Prolia  60 mg SQ given 02/09/2023   Generalized anxiety disorder Assessment & Plan: Anxiety managed with lorazepam  as needed. Discussed risks of lorazepam , including potential for addiction and dizziness. Patient prefers non-pharmacological strategies for anxiety management due to past negative experiences with similar medications. - Continue lorazepam  as needed, with caution - Avoid driving after taking lorazepam  - Discuss non-pharmacological strategies for anxiety management   Gastroesophageal reflux disease, unspecified whether esophagitis present Assessment & Plan: Reports burning sensation from epigastric region to left armpit, mainly after overeating. Symptoms managed with occasional use of Tums. Discussed potential benefits of starting omeprazole for symptom control. Patient concerned about potential interaction with  calcium  supplements. Advised to take omeprazole 30 minutes before meals and calcium  with meals to avoid interaction. - Start omeprazole daily, 30 minutes before meals - Avoid taking omeprazole and calcium  supplements simultaneously - Follow up in two weeks to assess symptom control    Return in about 10 days (around 06/16/2023) for HTN.   - Monitor blood pressure at home and bring logs to next visit.  I discussed the assessment and treatment plan with the patient  The patient was provided an opportunity to ask questions and all were answered. The patient agreed with the plan and demonstrated an understanding of the instructions.   The patient was advised to call back or seek an in-person evaluation if the symptoms worsen or if the condition fails to improve as anticipated.    LAURAINE LOISE BUOY, DO  [facility] [phone] (phone) [phone] (fax)  [facility]

## 2023-06-07 ENCOUNTER — Encounter: Payer: Self-pay | Admitting: Family Medicine

## 2023-06-07 DIAGNOSIS — F411 Generalized anxiety disorder: Secondary | ICD-10-CM | POA: Insufficient documentation

## 2023-06-07 DIAGNOSIS — K219 Gastro-esophageal reflux disease without esophagitis: Secondary | ICD-10-CM | POA: Insufficient documentation

## 2023-06-07 DIAGNOSIS — M810 Age-related osteoporosis without current pathological fracture: Secondary | ICD-10-CM | POA: Insufficient documentation

## 2023-06-07 DIAGNOSIS — Z7689 Persons encountering health services in other specified circumstances: Secondary | ICD-10-CM | POA: Insufficient documentation

## 2023-06-07 LAB — COMPREHENSIVE METABOLIC PANEL
ALT: 22 [IU]/L (ref 0–32)
AST: 36 [IU]/L (ref 0–40)
Albumin: 4.7 g/dL — ABNORMAL HIGH (ref 3.6–4.6)
Alkaline Phosphatase: 74 [IU]/L (ref 44–121)
BUN/Creatinine Ratio: 15 (ref 12–28)
BUN: 10 mg/dL (ref 10–36)
Bilirubin Total: 0.6 mg/dL (ref 0.0–1.2)
CO2: 25 mmol/L (ref 20–29)
Calcium: 9.8 mg/dL (ref 8.7–10.3)
Chloride: 95 mmol/L — ABNORMAL LOW (ref 96–106)
Creatinine, Ser: 0.68 mg/dL (ref 0.57–1.00)
Globulin, Total: 2.2 g/dL (ref 1.5–4.5)
Glucose: 92 mg/dL (ref 70–99)
Potassium: 3.8 mmol/L (ref 3.5–5.2)
Sodium: 136 mmol/L (ref 134–144)
Total Protein: 6.9 g/dL (ref 6.0–8.5)
eGFR: 82 mL/min/{1.73_m2} (ref >59)

## 2023-06-07 LAB — LIPID PANEL
Chol/HDL Ratio: 2.4 {ratio} (ref 0.0–4.4)
Cholesterol, Total: 199 mg/dL (ref 100–199)
HDL: 84 mg/dL (ref >39)
LDL Chol Calc (NIH): 104 mg/dL — ABNORMAL HIGH (ref 0–99)
Triglycerides: 57 mg/dL (ref 0–149)
VLDL Cholesterol Cal: 11 mg/dL (ref 5–40)

## 2023-06-07 LAB — TSH RFX ON ABNORMAL TO FREE T4: TSH: 1.89 u[IU]/mL (ref 0.450–4.500)

## 2023-06-07 NOTE — Assessment & Plan Note (Signed)
 Anxiety managed with lorazepam  as needed. Discussed risks of lorazepam , including potential for addiction and dizziness. Patient prefers non-pharmacological strategies for anxiety management due to past negative experiences with similar medications. - Continue lorazepam  as needed, with caution - Avoid driving after taking lorazepam  - Discuss non-pharmacological strategies for anxiety management

## 2023-06-07 NOTE — Assessment & Plan Note (Addendum)
 Patient notes that she was 5'2 in her 58s; she is 4'10 now. DEXA scan 11/29/2021 left femoral neck T-score was -4.2, worsened from T-score of -3.6 on 03/27/2019, with a statistically significant change of -15.8%.   - Patient follows with endocrinology; will defer to their management. -Continue calcium  and vitamin D supplementation. -History of 11 total fractures to date, per endocrinology note 01/25/2023 -Previously received Reclast ; now on Prolia . Last Prolia  60 mg SQ given 02/09/2023

## 2023-06-07 NOTE — Assessment & Plan Note (Signed)
 Reports burning sensation from epigastric region to left armpit, mainly after overeating. Symptoms managed with occasional use of Tums. Discussed potential benefits of starting omeprazole for symptom control. Patient concerned about potential interaction with calcium  supplements. Advised to take omeprazole 30 minutes before meals and calcium  with meals to avoid interaction. - Start omeprazole daily, 30 minutes before meals - Avoid taking omeprazole and calcium  supplements simultaneously - Follow up in two weeks to assess symptom control

## 2023-06-07 NOTE — Assessment & Plan Note (Signed)
 Routine health maintenance discussed, including dermatology follow-up for skin lesions and annual endocrinology visits for osteoporosis management. - Schedule dermatology follow-up if skin lesion persists - Continue annual endocrinology visits

## 2023-06-07 NOTE — Assessment & Plan Note (Signed)
Will check TSH today 

## 2023-06-20 ENCOUNTER — Ambulatory Visit: Payer: PPO | Admitting: Family Medicine

## 2023-06-30 DIAGNOSIS — H353132 Nonexudative age-related macular degeneration, bilateral, intermediate dry stage: Secondary | ICD-10-CM | POA: Diagnosis not present

## 2023-06-30 DIAGNOSIS — H2701 Aphakia, right eye: Secondary | ICD-10-CM | POA: Diagnosis not present

## 2023-06-30 DIAGNOSIS — H353221 Exudative age-related macular degeneration, left eye, with active choroidal neovascularization: Secondary | ICD-10-CM | POA: Diagnosis not present

## 2023-06-30 DIAGNOSIS — H53001 Unspecified amblyopia, right eye: Secondary | ICD-10-CM | POA: Diagnosis not present

## 2023-07-10 DIAGNOSIS — H2701 Aphakia, right eye: Secondary | ICD-10-CM | POA: Diagnosis not present

## 2023-07-10 DIAGNOSIS — H353222 Exudative age-related macular degeneration, left eye, with inactive choroidal neovascularization: Secondary | ICD-10-CM | POA: Diagnosis not present

## 2023-07-10 DIAGNOSIS — Z961 Presence of intraocular lens: Secondary | ICD-10-CM | POA: Diagnosis not present

## 2023-07-10 DIAGNOSIS — G43109 Migraine with aura, not intractable, without status migrainosus: Secondary | ICD-10-CM | POA: Diagnosis not present

## 2023-07-10 DIAGNOSIS — H02885 Meibomian gland dysfunction left lower eyelid: Secondary | ICD-10-CM | POA: Diagnosis not present

## 2023-07-10 DIAGNOSIS — H04123 Dry eye syndrome of bilateral lacrimal glands: Secondary | ICD-10-CM | POA: Diagnosis not present

## 2023-07-10 DIAGNOSIS — H02882 Meibomian gland dysfunction right lower eyelid: Secondary | ICD-10-CM | POA: Diagnosis not present

## 2023-07-10 DIAGNOSIS — H353113 Nonexudative age-related macular degeneration, right eye, advanced atrophic without subfoveal involvement: Secondary | ICD-10-CM | POA: Diagnosis not present

## 2023-07-10 DIAGNOSIS — H40051 Ocular hypertension, right eye: Secondary | ICD-10-CM | POA: Diagnosis not present

## 2023-07-12 ENCOUNTER — Ambulatory Visit: Payer: PPO | Admitting: Family Medicine

## 2023-07-12 ENCOUNTER — Encounter: Payer: Self-pay | Admitting: Family Medicine

## 2023-07-12 VITALS — BP 187/82 | HR 71 | Resp 18 | Ht <= 58 in | Wt 111.0 lb

## 2023-07-12 DIAGNOSIS — Z09 Encounter for follow-up examination after completed treatment for conditions other than malignant neoplasm: Secondary | ICD-10-CM | POA: Diagnosis not present

## 2023-07-12 DIAGNOSIS — K219 Gastro-esophageal reflux disease without esophagitis: Secondary | ICD-10-CM

## 2023-07-12 DIAGNOSIS — I10 Essential (primary) hypertension: Secondary | ICD-10-CM

## 2023-07-12 MED ORDER — LOSARTAN POTASSIUM 25 MG PO TABS: 25.0000 mg | ORAL_TABLET | Freq: Every day | ORAL | 1 refills | Status: DC
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
## 2023-07-12 NOTE — Assessment & Plan Note (Signed)
GERD, managed effectively with dietary modifications. Patient declined omeprazole due to lack of symptoms and concerns about side effects. Discussed risks and benefits of omeprazole, patient prefers dietary management. - Continue dietary modifications to manage GERD symptoms

## 2023-07-12 NOTE — Assessment & Plan Note (Signed)
Hypertension, improved with losartan. Blood pressures mostly in the 120s-130s, occasionally reaching 160s. No dizziness or lightheadedness reported. Patient feels more energetic and is exercising more. Discussed risks and benefits of continuing losartan. Patient prefers to continue current medication regimen. - Continue losartan 25 mg daily - Continue atenolol 25 mg daily - Send refill for losartan to pharmacy

## 2023-08-08 DIAGNOSIS — M79675 Pain in left toe(s): Secondary | ICD-10-CM | POA: Diagnosis not present

## 2023-08-08 DIAGNOSIS — B351 Tinea unguium: Secondary | ICD-10-CM | POA: Diagnosis not present

## 2023-08-08 DIAGNOSIS — M79674 Pain in right toe(s): Secondary | ICD-10-CM | POA: Diagnosis not present

## 2023-08-09 ENCOUNTER — Encounter: Payer: Self-pay | Admitting: Emergency Medicine

## 2023-08-09 DIAGNOSIS — Z8619 Personal history of other infectious and parasitic diseases: Secondary | ICD-10-CM | POA: Insufficient documentation

## 2023-08-09 DIAGNOSIS — C649 Malignant neoplasm of unspecified kidney, except renal pelvis: Secondary | ICD-10-CM | POA: Insufficient documentation

## 2023-08-15 DIAGNOSIS — M810 Age-related osteoporosis without current pathological fracture: Secondary | ICD-10-CM | POA: Diagnosis not present

## 2023-08-16 ENCOUNTER — Ambulatory Visit: Admitting: Emergency Medicine

## 2023-08-16 VITALS — Ht <= 58 in | Wt 105.0 lb

## 2023-08-16 DIAGNOSIS — Z0000 Encounter for general adult medical examination without abnormal findings: Secondary | ICD-10-CM

## 2023-08-16 NOTE — Patient Instructions (Addendum)
 Katie Hanna , Thank you for taking time to come for your Medicare Wellness Visit. I appreciate your ongoing commitment to your health goals. Please review the following plan we discussed and let me know if I can assist you in the future.   Referrals/Orders/Follow-Ups/Clinician Recommendations: You are due for a tetanus vaccine.   This is a list of the screening recommended for you and due dates:  Health Maintenance  Topic Date Due   Flu Shot  08/28/2023*   DTaP/Tdap/Td vaccine (2 - Td or Tdap) 07/11/2024*   Medicare Annual Wellness Visit  08/15/2024   Pneumonia Vaccine  Completed   DEXA scan (bone density measurement)  Completed   HPV Vaccine  Aged Out   COVID-19 Vaccine  Discontinued   Zoster (Shingles) Vaccine  Discontinued  *Topic was postponed. The date shown is not the original due date.    Advanced directives: (Copy Requested) Please bring a copy of your health care power of attorney and living will to the office to be added to your chart at your convenience. You can mail to [facility] [address] [place], [place] [address] or email to [email or web address]  Next Medicare Annual Wellness Visit scheduled for next year: Yes, 08/21/24 @ 9:30am (phone visit)

## 2023-08-16 NOTE — Progress Notes (Signed)
 Subjective:   Katie Hanna is a 89 y.o. who presents for a Medicare Wellness preventive visit.  Visit Complete: Virtual I connected with  Katie Hanna on 08/16/23 by a audio enabled telemedicine application and verified that I am speaking with the correct person using two identifiers.  Patient Location: Home  Provider Location: Home Office  I discussed the limitations of evaluation and management by telemedicine. The patient expressed understanding and agreed to proceed.  Vital Signs: Because this visit was a virtual/telehealth visit, some criteria may be missing or patient reported. Any vitals not documented were not able to be obtained and vitals that have been documented are patient reported.  VideoDeclined- This patient declined Librarian, academic. Therefore the visit was completed with audio only.  Persons Participating in Visit:  Roe Coombs, husband and patient was present during visit.  AWV Questionnaire: No: Patient Medicare AWV questionnaire was not completed prior to this visit.  Cardiac Risk Factors include: advanced age (>38men, >62 women);hypertension;sedentary lifestyle     Objective:    Today's Vitals   08/16/23 0930  Weight: 105 lb (47.6 kg)  Height: 4\' 10"  (1.473 m)   Body mass index is 21.95 kg/m.     08/16/2023    9:48 AM 11/15/2019    7:00 PM 11/15/2019    1:55 PM 07/11/2019    4:15 PM 02/04/2018    2:39 PM 12/07/2017    8:18 PM  Advanced Directives  Does Patient Have a Medical Advance Directive? Yes Yes No No Yes Yes  Type of Advance Directive Living will Living will;Healthcare Power of Attorney   Living will Living will  Does patient want to make changes to medical advance directive? No - Patient declined No - Patient declined      Copy of Healthcare Power of Attorney in Chart?  No - copy requested      Would patient like information on creating a medical advance directive?   No - Patient declined No - Patient declined       Current Medications (verified) Outpatient Encounter Medications as of 08/16/2023  Medication Sig   ascorbic acid (VITAMIN C) 1000 MG tablet Take 1,000 mg by mouth daily.   atenolol (TENORMIN) 25 MG tablet Take 25 mg by mouth daily.   Calcium Citrate 250 MG TABS Take 500 mg by mouth 3 (three) times daily.   carboxymethylcellulose (REFRESH PLUS) 0.5 % SOLN Place 1 drop into both eyes 3 (three) times daily as needed.   Cholecalciferol 10 MCG (400 UNIT) CAPS Take 1 capsule by mouth daily.   LORazepam (ATIVAN) 0.5 MG tablet Take 0.5 mg by mouth 2 (two) times daily as needed.   losartan (COZAAR) 25 MG tablet Take 1 tablet (25 mg total) by mouth daily.   MAGNESIUM PO Take by mouth.   Multiple Vitamin (MULTI-VITAMIN) tablet Take 1 tablet by mouth daily.   Multiple Vitamins-Minerals (ZINC PO) Take by mouth.   No facility-administered encounter medications on file as of 08/16/2023.    Allergies (verified) Omnipaque [iohexol], Alendronate, Gluten meal, Hydrocodone-acetaminophen, Other, and Wheat   History: Past Medical History:  Diagnosis Date   Allergy    unknown   Anxiety    Cancer (HCC) 2007   rfa for rt. kidney tumor   Cataract Dec. 2022   Hypertension    Osteoporosis    Thyroid disease unknown   goiter   Past Surgical History:  Procedure Laterality Date   BACK SURGERY     EYE MUSCLE  SURGERY     SPINE SURGERY     vertebroplasticy   TONSILLECTOMY     TUBAL LIGATION     Family History  Problem Relation Age of Onset   Anxiety disorder Mother    Cancer Mother    Hypertension Brother    Social History   Socioeconomic History   Marital status: Married    Spouse name: Roe Coombs   Number of children: 1   Years of education: Not on file   Highest education level: Master's degree (e.g., MA, MS, MEng, MEd, MSW, MBA)  Occupational History   Occupation: retired  Tobacco Use   Smoking status: Never   Smokeless tobacco: Never  Vaping Use   Vaping status: Never Used  Substance  and Sexual Activity   Alcohol use: Never   Drug use: Never   Sexual activity: Not Currently    Birth control/protection: Surgical  Other Topics Concern   Not on file  Social History Narrative   1 son deceased   Social Drivers of Health   Financial Resource Strain: Low Risk  (08/16/2023)   Overall Financial Resource Strain (CARDIA)    Difficulty of Paying Living Expenses: Not hard at all  Food Insecurity: No Food Insecurity (08/16/2023)   Hunger Vital Sign    Worried About Running Out of Food in the Last Year: Never true    Ran Out of Food in the Last Year: Never true  Transportation Needs: No Transportation Needs (08/16/2023)   PRAPARE - Administrator, Civil Service (Medical): No    Lack of Transportation (Non-Medical): No  Physical Activity: Insufficiently Active (08/16/2023)   Exercise Vital Sign    Days of Exercise per Week: 7 days    Minutes of Exercise per Session: 20 min  Stress: Stress Concern Present (08/16/2023)   [employer] of Occupational Health - Occupational Stress Questionnaire    Feeling of Stress : To some extent  Social Connections: Socially Isolated (08/16/2023)   Social Connection and Isolation Panel [NHANES]    Frequency of Communication with Friends and Family: Once a week    Frequency of Social Gatherings with Friends and Family: Never    Attends Religious Services: Never    Database administrator or Organizations: No    Attends Engineer, structural: Never    Marital Status: Married    Tobacco Counseling Counseling given: Not Answered    Clinical Intake:  Pre-visit preparation completed: Yes  Pain : No/denies pain     BMI - recorded: 21.95 Nutritional Status: BMI of 19-24  Normal Nutritional Risks: None Diabetes: No  Lab Results  Component Value Date   HGBA1C 5.4 11/16/2019     How often do you need to have someone help you when you read instructions, pamphlets, or other written materials from your doctor or  pharmacy?: 1 - Never  Interpreter Needed?: No  Information entered by :: Tora Kindred, CMA   Activities of Daily Living     08/16/2023    9:35 AM  In your present state of health, do you have any difficulty performing the following activities:  Hearing? 1  Comment hearing aids  Vision? 0  Difficulty concentrating or making decisions? 0  Walking or climbing stairs? 1  Comment rollator  Dressing or bathing? 0  Doing errands, shopping? 1  Comment doesn't drive, husband takes to appointments  Preparing Food and eating ? N  Using the Toilet? N  In the past six months, have you accidently leaked  urine? Y  Comment wears pad  Do you have problems with loss of bowel control? N  Managing your Medications? N  Managing your Finances? N  Housekeeping or managing your Housekeeping? N    Patient Care Team: Pardue, Monico Blitz, DO as PCP - General (Family Medicine) Domingo Madeira, OD (Optometry)  Indicate any recent Medical Services you may have received from other than [facility] providers in the past year (date may be approximate).     Assessment:   This is a routine wellness examination for Maekayla.  Hearing/Vision screen Hearing Screening - Comments:: Wears hearing aids Vision Screening - Comments:: Gets eye exams, Dr. Edger House, [place]   Goals Addressed             This Visit's Progress    Patient Stated       Be able to do a little more exercise without injury       Depression Screen     08/16/2023    9:46 AM 06/06/2023   10:51 AM  PHQ 2/9 Scores  PHQ - 2 Score 0 0  PHQ- 9 Score 1 1    Fall Risk     08/16/2023    9:50 AM  Fall Risk   Falls in the past year? 0  Number falls in past yr: 0  Injury with Fall? 0  Risk for fall due to : No Fall Risks  Follow up Falls prevention discussed;Falls evaluation completed    MEDICARE RISK AT HOME:  Medicare Risk at Home Any stairs in or around the home?: Yes If so, are there any without handrails?: No Home free of  loose throw rugs in walkways, pet beds, electrical cords, etc?: Yes Adequate lighting in your home to reduce risk of falls?: Yes Life alert?: No Use of a cane, walker or w/c?: Yes (rollator) Grab bars in the bathroom?: Yes Shower chair or bench in shower?: Yes Elevated toilet seat or a handicapped toilet?: Yes  TIMED UP AND GO:  Was the test performed?  No  Cognitive Function: 6CIT completed        08/16/2023    9:52 AM  6CIT Screen  What Year? 0 points  What month? 0 points  What time? 0 points  Count back from 20 0 points  Months in reverse 0 points  Repeat phrase 0 points  Total Score 0 points    Immunizations Immunization History  Administered Date(s) Administered   Influenza Inj Mdck Quad Pf 03/16/2016, 03/08/2018   Influenza Inj Mdck Quad With Preservative 03/08/2017   Influenza, High Dose Seasonal PF 02/26/2015   Influenza,inj,Quad PF,6+ Mos 01/30/2013, 03/27/2014   Influenza-Unspecified 03/10/2021   PFIZER Comirnaty(Gray Top)Covid-19 Tri-Sucrose Vaccine 07/04/2019   Pneumococcal Conjugate-13 05/09/2014   Pneumococcal Polysaccharide-23 03/08/2017   Tdap 10/19/2010    Screening Tests Health Maintenance  Topic Date Due   INFLUENZA VACCINE  08/28/2023 (Originally 12/29/2022)   DTaP/Tdap/Td (2 - Td or Tdap) 07/11/2024 (Originally 10/18/2020)   Medicare Annual Wellness (AWV)  08/15/2024   Pneumonia Vaccine 55+ Years old  Completed   DEXA SCAN  Completed   HPV VACCINES  Aged Out   COVID-19 Vaccine  Discontinued   Zoster Vaccines- Shingrix  Discontinued    Health Maintenance  There are no preventive care reminders to display for this patient. Health Maintenance Items Addressed: See Nurse Notes  Additional Screening:  Vision Screening: Recommended annual ophthalmology exams for early detection of glaucoma and other disorders of the eye.  Dental Screening: Recommended  annual dental exams for proper oral hygiene  Community Resource Referral / Chronic Care  Management: CRR required this visit?  No   CCM required this visit?  No     Plan:     I have personally reviewed and noted the following in the patient's chart:   Medical and social history Use of alcohol, tobacco or illicit drugs  Current medications and supplements including opioid prescriptions. Patient is not currently taking opioid prescriptions. Functional ability and status Nutritional status Physical activity Advanced directives List of other physicians Hospitalizations, surgeries, and ER visits in previous 12 months Vitals Screenings to include cognitive, depression, and falls Referrals and appointments  In addition, I have reviewed and discussed with patient certain preventive protocols, quality metrics, and best practice recommendations. A written personalized care plan for preventive services as well as general preventive health recommendations were provided to patient.     Tora Kindred, CMA   08/16/2023   After Visit Summary: (MyChart) Due to this being a telephonic visit, the after visit summary with patients personalized plan was offered to patient via MyChart   Notes:  Roe Coombs, husband was present during today's visit. Needs Tdap vaccine Last DEXA 11/29/21, osteoporosis, followed by endocrinology Declined covid and shingles vaccines

## 2023-08-21 ENCOUNTER — Other Ambulatory Visit: Payer: Self-pay | Admitting: Family Medicine

## 2023-09-08 ENCOUNTER — Ambulatory Visit (INDEPENDENT_AMBULATORY_CARE_PROVIDER_SITE_OTHER): Payer: PPO | Admitting: Family Medicine

## 2023-09-08 ENCOUNTER — Encounter: Payer: Self-pay | Admitting: Family Medicine

## 2023-09-08 VITALS — BP 145/78 | HR 71 | Ht <= 58 in | Wt 105.0 lb

## 2023-09-08 DIAGNOSIS — I10 Essential (primary) hypertension: Secondary | ICD-10-CM

## 2023-09-08 DIAGNOSIS — R110 Nausea: Secondary | ICD-10-CM

## 2023-09-08 DIAGNOSIS — M810 Age-related osteoporosis without current pathological fracture: Secondary | ICD-10-CM | POA: Diagnosis not present

## 2023-09-08 DIAGNOSIS — J301 Allergic rhinitis due to pollen: Secondary | ICD-10-CM | POA: Diagnosis not present

## 2023-09-08 DIAGNOSIS — H9190 Unspecified hearing loss, unspecified ear: Secondary | ICD-10-CM | POA: Diagnosis not present

## 2023-09-08 DIAGNOSIS — M791 Myalgia, unspecified site: Secondary | ICD-10-CM | POA: Diagnosis not present

## 2023-09-08 DIAGNOSIS — K589 Irritable bowel syndrome without diarrhea: Secondary | ICD-10-CM | POA: Diagnosis not present

## 2023-09-08 LAB — POC COVID19/FLU A&B COMBO
Covid Antigen, POC: NEGATIVE
Influenza A Antigen, POC: NEGATIVE
Influenza B Antigen, POC: NEGATIVE

## 2023-09-08 MED ORDER — CELECOXIB 50 MG PO CAPS: 50.0000 mg | ORAL_CAPSULE | Freq: Two times a day (BID) | ORAL | 0 refills | Status: DC

## 2023-09-08 MED ORDER — ONDANSETRON 4 MG PO TBDP: 4.0000 mg | ORAL_TABLET | Freq: Three times a day (TID) | ORAL | 0 refills | Status: DC | PRN

## 2023-09-08 MED ORDER — FLUTICASONE PROPIONATE 50 MCG/ACT NA SUSP: 2.0000 | Freq: Every day | NASAL | 6 refills | Status: AC | PRN

## 2023-09-08 NOTE — Progress Notes (Signed)
 Established patient visit   Patient: Katie Hanna   DOB: May 03, 1930   89 y.o. Female  MRN: [id number] Visit Date: 09/08/2023  Today's healthcare provider: Sherlyn Hay, DO   Chief Complaint  Patient presents with   Hypertension    Has been sick with a headache for about a week now and pain in the muscles not bones and all over body pain   Subjective    Hypertension Associated symptoms include headaches. Pertinent negatives include no chest pain or shortness of breath.   Katie Hanna is a 89 year old female with osteoporosis and macular degeneration who presents for follow-up of her blood pressure and with severe headache and muscle pain. She is accompanied by her husband who is her caregiver.  She brought her blood pressure log and with her, which she has been keeping since November 2024.  Since her last visit in February, her pressures have been in the 130s-140s/60s-70s with occasional SBPs in the 120s, and with the exception of this past week while she has been feeling unwell, in which her pressures have been 140s-180s/70s-80s.  She has been experiencing severe headache and muscle pain for over a week. The headache began with aches in her shoulder, spread to her hip, and eventually became generalized muscular pain. The headache is constant and particularly severe behind her eyes. She feels very sick, with difficulty thinking and remembering.  She received her second Prolia injection on August 15, 2023. She has been experiencing has headache, muscle pain, and nausea which started around 08/30/2023. She did not experience these symptoms after her first injection. She has been taking regular strength Tylenol, starting with half a tablet and increasing to a full tablet twice a day, but continues to feel miserable.  She has a history of macular degeneration and is scheduled for subsequent injections to treat it. She is concerned about the procedure due to her current symptoms and  fears of infection.  She has a history of multiple vertebral fractures due to osteoporosis, with twelve vertebrae having fractured over time. She has been avoided taking ibuprofen due to experiencing gastrointestinal issues (abdominal discomfort) after trying one a couple years ago.  She also has a history of irritable bowel syndrome since her early twenties.  She experiences nausea at the thought of food and has been forcing herself to eat small bites to maintain strength. She is able to drink fluids. She has been experiencing increased sensitivity to light and has not attempted to read due to fear of upsetting her vision. Her right eye lacks a lens due to complications from cataract surgery.  No fever, but she has been feeling colder than usual, which she attributes to the weather. No ear or throat pain and reports occasional sneezing. She has a history of a deviated septum due to a childhood injury.      Medications: Outpatient Medications Prior to Visit  Medication Sig   ascorbic acid (VITAMIN C) 1000 MG tablet Take 1,000 mg by mouth daily.   atenolol (TENORMIN) 25 MG tablet Take 25 mg by mouth daily.   Calcium Citrate 250 MG TABS Take 500 mg by mouth 3 (three) times daily.   carboxymethylcellulose (REFRESH PLUS) 0.5 % SOLN Place 1 drop into both eyes 3 (three) times daily as needed.   Cholecalciferol 10 MCG (400 UNIT) CAPS Take 1 capsule by mouth daily.   denosumab (PROLIA) 60 MG/ML SOSY injection Inject 60 mg into the skin every 6 (six)  months.   LORazepam (ATIVAN) 0.5 MG tablet TAKE ONE TABLET BY MOUTH EVERY DAY AS NEEDED   losartan (COZAAR) 25 MG tablet Take 1 tablet (25 mg total) by mouth daily.   MAGNESIUM PO Take by mouth.   Multiple Vitamin (MULTI-VITAMIN) tablet Take 1 tablet by mouth daily.   Multiple Vitamins-Minerals (ZINC PO) Take by mouth.   No facility-administered medications prior to visit.    Review of Systems  Constitutional:  Negative for chills and fever.   HENT:  Positive for rhinorrhea (chronic) and sneezing (Occasional). Negative for ear pain, sinus pressure, sinus pain and sore throat.   Eyes:  Negative for visual disturbance (No acute changes).  Respiratory:  Negative for cough and shortness of breath.   Cardiovascular:  Negative for chest pain.  Gastrointestinal:  Positive for nausea. Negative for abdominal pain, constipation, diarrhea and vomiting.  Endocrine: Positive for cold intolerance (chronic, at baseline).  Musculoskeletal:  Positive for arthralgias and myalgias.  Neurological:  Positive for headaches.        Objective    BP (!) 145/78 (BP Location: Left Arm, Patient Position: Sitting, Cuff Size: Small)   Pulse 71   Ht 4\' 10"  (1.473 m)   Wt 105 lb (47.6 kg)   SpO2 97%   BMI 21.95 kg/m     Physical Exam Vitals reviewed.  Constitutional:      General: She is not in acute distress.    Appearance: Normal appearance. She is well-developed. She is not diaphoretic.  HENT:     Head: Normocephalic and atraumatic.     Right Ear: Tympanic membrane, ear canal and external ear normal.     Left Ear: Tympanic membrane, ear canal and external ear normal.     Nose: Nose normal.  Eyes:     General: No scleral icterus.    Extraocular Movements: Extraocular movements intact.     Conjunctiva/sclera: Conjunctivae normal.     Pupils: Pupils are equal, round, and reactive to light.  Cardiovascular:     Rate and Rhythm: Normal rate and regular rhythm.     Pulses: Normal pulses.     Heart sounds: Normal heart sounds. No murmur heard. Pulmonary:     Effort: Pulmonary effort is normal. No respiratory distress.     Breath sounds: Normal breath sounds. No wheezing or rales.  Abdominal:     General: Bowel sounds are normal.     Tenderness: There is no abdominal tenderness.     Comments: Spinal support brace in place.  Musculoskeletal:     Cervical back: Neck supple.     Right lower leg: No edema.     Left lower leg: No edema.      Comments: Requires rollator  Lymphadenopathy:     Cervical: No cervical adenopathy.  Skin:    General: Skin is warm and dry.     Findings: No rash.  Neurological:     Mental Status: She is alert and oriented to person, place, and time. Mental status is at baseline.  Psychiatric:        Mood and Affect: Mood normal.        Behavior: Behavior normal.      Results for orders placed or performed in visit on 09/08/23  POC Covid19/Flu A&B Antigen  Result Value Ref Range   Influenza A Antigen, POC Negative Negative   Influenza B Antigen, POC Negative Negative   Covid Antigen, POC Negative Negative    Assessment & Plan    Essential (primary)  hypertension  Myalgia -     POC Covid19/Flu A&B Antigen -     Celecoxib; Take 1 capsule (50 mg total) by mouth 2 (two) times daily.  Dispense: 40 capsule; Refill: 0 -     CBC with Differential/Platelet -     Sedimentation rate -     ANA w/Reflex if Positive  Nausea -     POC Covid19/Flu A&B Antigen -     Ondansetron; Take 1 tablet (4 mg total) by mouth every 8 (eight) hours as needed for nausea or vomiting.  Dispense: 20 tablet; Refill: 0 -     CBC with Differential/Platelet  Age-related osteoporosis without current pathological fracture  Seasonal allergic rhinitis due to pollen -     Fluticasone Propionate; Place 2 sprays into both nostrils daily as needed for allergies or rhinitis (left nostril).  Dispense: 16 g; Refill: 6  Hard of hearing  Irritable bowel syndrome, unspecified type    Essential hypertension Chronic, stable and overall well-controlled.  Given patient's advanced age and severe osteoporosis with multiple fragility fractures, the benefit of achieving tighter blood pressure control does not exceed the risk of causing dizziness and potential falls. - Continue losartan 25 mg daily - Continue atenolol 25 mg daily  Headaches, myalgias and nausea; suspected adverse reaction to Prolia Severe headaches, muscle pain, and  nausea likely due to Prolia. Risk of untreated osteoporosis outweighs side effects. Discussed alternative medications. - Prescribed low-dose Celebrex with food for pain management. - POC Covid/flu A/B swab all negative - Order blood count, ANA w/relfex and ESR to check if symptoms worsen.  Osteoporosis  Osteoporosis managed with Prolia.  Discussed alternatives. - Continue Prolia or consider alternatives; discuss symptoms with endocrinology.  Hypertension Elevated blood pressure likely due to pain and anxiety. Current levels acceptable given fall risk.  Irritable Bowel Syndrome (IBS) IBS limits NSAID use. Considered when prescribing Celebrex. - Prescribed low-dose Celebrex with food to minimize gastrointestinal side effects.  Macular degeneration Scheduled for intravitreal injection. Concerned about procedure due to current health.  Hearing impairment Uses hearing aids with some benefit.   Return in about 3 months (around 12/08/2023), or if symptoms worsen or fail to improve, for chronic f/u.      I discussed the assessment and treatment plan with the patient  The patient was provided an opportunity to ask questions and all were answered. The patient agreed with the plan and demonstrated an understanding of the instructions.   The patient was advised to call back or seek an in-person evaluation if the symptoms worsen or if the condition fails to improve as anticipated.  Total time was 50 minutes. That includes chart review before the visit, the actual patient visit, and time spent on documentation after the visit.     Sherlyn Hay, DO  [facility] [phone] (phone) [phone] (fax)  [facility]

## 2023-09-14 ENCOUNTER — Other Ambulatory Visit: Payer: Self-pay | Admitting: Family Medicine

## 2023-09-14 ENCOUNTER — Telehealth: Payer: Self-pay

## 2023-09-14 DIAGNOSIS — M791 Myalgia, unspecified site: Secondary | ICD-10-CM

## 2023-09-14 DIAGNOSIS — R110 Nausea: Secondary | ICD-10-CM | POA: Diagnosis not present

## 2023-09-14 MED ORDER — CELECOXIB 50 MG PO CAPS: 100.0000 mg | ORAL_CAPSULE | Freq: Two times a day (BID) | ORAL | 0 refills | Status: DC | PRN

## 2023-09-14 NOTE — Telephone Encounter (Signed)
 Sent new prescription and advised patient/spouse that she make take a maximum of 4 per day and that we may need to consider an alternative if that is not addressing her pain.  She will likely need a follow up visit to address this and discuss alternatives if it becomes necessary.

## 2023-09-14 NOTE — Telephone Encounter (Signed)
 I called to get clarification on this message and Mr Grzesiak said he did not mention Ondansetron he told them Celebrex.  He said it is not touching her pain.  Rx was written to take 2 in a day and she has been taking 3 in a day and still not getting relief.  Would like to know what else can be done   Copied from CRM [id number]. Topic: Clinical - Medication Question >> Sep 14, 2023  1:14 PM Ivette P wrote: Reason for CRM: Pt husband is calling in about medication ondansetron (ZOFRAN-ODT) 4 MG disintegrating tablet, was told to take 2 a day pt has been taking 3 a day. Husband wants to know if she is allowed to take more. Pt is still experiencing pain and Jonell Neptune would like to know if the medication can be upped.    Pt callback [phone]

## 2023-09-15 ENCOUNTER — Telehealth: Payer: Self-pay

## 2023-09-15 LAB — ANA W/REFLEX IF POSITIVE
Anti JO-1: <0.2 AI (ref 0.0–0.9)
Anti Nuclear Antibody (ANA): POSITIVE — AB
Centromere Ab Screen: <0.2 AI (ref 0.0–0.9)
Chromatin Ab SerPl-aCnc: <0.2 AI (ref 0.0–0.9)
ENA RNP Ab: 1.2 AI — ABNORMAL HIGH (ref 0.0–0.9)
ENA SM Ab Ser-aCnc: <0.2 AI (ref 0.0–0.9)
ENA SSA (RO) Ab: <0.2 AI (ref 0.0–0.9)
ENA SSB (LA) Ab: <0.2 AI (ref 0.0–0.9)
Scleroderma (Scl-70) (ENA) Antibody, IgG: <0.2 AI (ref 0.0–0.9)
dsDNA Ab: <1 [IU]/mL (ref 0–9)

## 2023-09-15 LAB — CBC WITH DIFFERENTIAL/PLATELET
Basophils Absolute: 0 10*3/uL (ref 0.0–0.2)
Basos: 0 %
EOS (ABSOLUTE): 0 10*3/uL (ref 0.0–0.4)
Eos: 1 %
Hematocrit: 42.1 % (ref 34.0–46.6)
Hemoglobin: 13.9 g/dL (ref 11.1–15.9)
Immature Grans (Abs): 0 10*3/uL (ref 0.0–0.1)
Immature Granulocytes: 1 %
Lymphocytes Absolute: 1.1 10*3/uL (ref 0.7–3.1)
Lymphs: 15 %
MCH: 30.2 pg (ref 26.6–33.0)
MCHC: 33 g/dL (ref 31.5–35.7)
MCV: 91 fL (ref 79–97)
Monocytes Absolute: 0.7 10*3/uL (ref 0.1–0.9)
Monocytes: 9 %
Neutrophils Absolute: 5.7 10*3/uL (ref 1.4–7.0)
Neutrophils: 74 %
Platelets: 214 10*3/uL (ref 150–450)
RBC: 4.61 x10E6/uL (ref 3.77–5.28)
RDW: 12.6 % (ref 11.7–15.4)
WBC: 7.6 10*3/uL (ref 3.4–10.8)

## 2023-09-15 LAB — SEDIMENTATION RATE: Sed Rate: 5 mm/h (ref 0–40)

## 2023-09-15 NOTE — Telephone Encounter (Signed)
 Copied from CRM [id number]. Topic: Clinical - Medication Question >> Sep 15, 2023 11:49 AM Georgeann Kindred wrote: Reason for CRM: insurance company calling with questions regarding patient's medication of Celecoxib . Would like to know the associated Dx and reason for her taking 4 pills per day. Please contact insurance company at [phone] >> Sep 15, 2023 12:06 PM [place] wrote: Lowell Rude with [employer] calling back stating call was disconnected when speaking with someone.  They need to know why the dosage instructions show more than the allowed amount, please return call [phone] option 2

## 2023-09-18 ENCOUNTER — Encounter: Payer: Self-pay | Admitting: Family Medicine

## 2023-09-18 ENCOUNTER — Other Ambulatory Visit: Payer: Self-pay | Admitting: Family Medicine

## 2023-09-18 DIAGNOSIS — M545 Low back pain, unspecified: Secondary | ICD-10-CM

## 2023-09-18 MED ORDER — CELECOXIB 100 MG PO CAPS: 100.0000 mg | ORAL_CAPSULE | Freq: Two times a day (BID) | ORAL | 0 refills | Status: AC

## 2023-09-22 ENCOUNTER — Telehealth: Payer: Self-pay

## 2023-09-22 NOTE — Telephone Encounter (Signed)
 Copied from CRM [id number]. Topic: General - Other >> Sep 18, 2023 10:36 AM Shereese L wrote: Reason for CRM: [employer] celecoxib  (CELEBREX ) 50 MG capsule  SCRIPT WRITTEN FOR 120 FOR 30 DAYS SPOKE WITH OFFICE AND WAS ADVISED THAT A FAX HAS BEEN RECEIVED ALONG WITH PREVIOUS CALLS. DR. Athena Bland IS WITH OTHER PATIENTS AND WHEN SHE'S AVAILABLE SHE WILL BE SENDING THE FAX BACK TO [phone] >> Sep 22, 2023  8:48 AM Smiley Dung G wrote: Katie Hanna w/[employer] working on prior auth. In regards to celebrex  needs to know diagnosis and the clinical reasons to need more capsules each day pls call her back [phone] fax [phone]

## 2023-10-03 DIAGNOSIS — H02882 Meibomian gland dysfunction right lower eyelid: Secondary | ICD-10-CM | POA: Diagnosis not present

## 2023-10-03 DIAGNOSIS — H353113 Nonexudative age-related macular degeneration, right eye, advanced atrophic without subfoveal involvement: Secondary | ICD-10-CM | POA: Diagnosis not present

## 2023-10-03 DIAGNOSIS — H2701 Aphakia, right eye: Secondary | ICD-10-CM | POA: Diagnosis not present

## 2023-10-03 DIAGNOSIS — H353222 Exudative age-related macular degeneration, left eye, with inactive choroidal neovascularization: Secondary | ICD-10-CM | POA: Diagnosis not present

## 2023-10-03 DIAGNOSIS — Z961 Presence of intraocular lens: Secondary | ICD-10-CM | POA: Diagnosis not present

## 2023-10-03 DIAGNOSIS — H40051 Ocular hypertension, right eye: Secondary | ICD-10-CM | POA: Diagnosis not present

## 2023-10-03 DIAGNOSIS — H04123 Dry eye syndrome of bilateral lacrimal glands: Secondary | ICD-10-CM | POA: Diagnosis not present

## 2023-10-03 DIAGNOSIS — H02885 Meibomian gland dysfunction left lower eyelid: Secondary | ICD-10-CM | POA: Diagnosis not present

## 2023-10-03 DIAGNOSIS — G43109 Migraine with aura, not intractable, without status migrainosus: Secondary | ICD-10-CM | POA: Diagnosis not present

## 2023-10-06 DIAGNOSIS — H353221 Exudative age-related macular degeneration, left eye, with active choroidal neovascularization: Secondary | ICD-10-CM | POA: Diagnosis not present

## 2023-10-31 ENCOUNTER — Ambulatory Visit: Admitting: Family Medicine

## 2023-11-03 ENCOUNTER — Ambulatory Visit: Admitting: Family Medicine

## 2023-11-03 ENCOUNTER — Encounter: Payer: Self-pay | Admitting: Family Medicine

## 2023-11-03 ENCOUNTER — Telehealth: Payer: Self-pay | Admitting: Family Medicine

## 2023-11-03 VITALS — BP 172/66 | HR 62 | Ht <= 58 in | Wt 107.7 lb

## 2023-11-03 DIAGNOSIS — H9190 Unspecified hearing loss, unspecified ear: Secondary | ICD-10-CM

## 2023-11-03 DIAGNOSIS — H3530 Unspecified macular degeneration: Secondary | ICD-10-CM

## 2023-11-03 DIAGNOSIS — M810 Age-related osteoporosis without current pathological fracture: Secondary | ICD-10-CM

## 2023-11-03 DIAGNOSIS — I10 Essential (primary) hypertension: Secondary | ICD-10-CM | POA: Diagnosis not present

## 2023-11-03 DIAGNOSIS — J301 Allergic rhinitis due to pollen: Secondary | ICD-10-CM | POA: Diagnosis not present

## 2023-11-03 DIAGNOSIS — R413 Other amnesia: Secondary | ICD-10-CM | POA: Insufficient documentation

## 2023-11-03 NOTE — Patient Instructions (Addendum)
 I do recommend getting a new blood pressure cuff (reliable brands include: Omron, Welch-Alynn and Microlife).   - please bring it and your log to your next visit.

## 2023-11-03 NOTE — Telephone Encounter (Signed)
 Copied from CRM [id number]. Topic: Medical Record Request - Records Request >> Nov 03, 2023  4:12 PM Crispin Dolphin wrote: Reason for CRM: Patient husband called. States checking after visit summary - blood pressure was listed as 125/69 which is what it was at home when they checked it but states that when she had visiti in office it was over 170 and he wants to see if records can be changed to show what it was when it was checked in office. Also wants to know if AWV is required. States patient hard of hearing and does not really want telephone visit. Thank You     Husband wants this changed on the after visit summary. He states the BP readings are incorrect.

## 2023-11-03 NOTE — Progress Notes (Signed)
 Established patient visit   Patient: Katie Hanna   DOB: 1930-04-23   89 y.o. Female  MRN: [id number] Visit Date: 11/03/2023  Today's healthcare provider: Carlean Charter, DO   Chief Complaint  Patient presents with   Medical Management of Chronic Issues   Hypertension   Subjective    Hypertension  Katie Hanna is a 89 year old female who presents for a follow-up visit.  She has been monitoring her blood pressure at home since May, noting improvements with occasional elevations. Her home blood pressure readings differ from those taken in the clinic, raising concerns about the accuracy of her home blood pressure cuff.  - Home Bps 110s-140s/60s-70s; no clear pattern.  Her headaches have significantly improved. She has a history of ocular migraines and recalls a past episode with visual disturbances that led to hospitalization for observation. She also notes a gradual decline in memory and vocabulary.  She has macular degeneration and no longer reads print, relying on audiobooks instead. Her hearing has declined by about fifty percent and worsens with allergies. She uses a nasal spray and takes half a tablet of Walzer (generic version of cetirizine) as needed for allergies, avoiding stronger medications due to sensitivity. She uses Flonase  in her left nostril and Xlear in her right to prevent epistaxis.  She reports decreased energy levels, attributing this to osteoporosis. She no longer engages in dancing, an activity she previously enjoyed. She has not been using Celebrex  recently as she has not needed pain relief.  She lives with her husband and has limited contact with other family members. She mentions a family history of cancer in her nephews and hereditary osteoporosis. She screened positive for depression but does not feel depressed, enjoying activities such as spending time with her cats and observing nature.       Medications: Outpatient Medications Prior to  Visit  Medication Sig   ascorbic acid  (VITAMIN C) 1000 MG tablet Take 1,000 mg by mouth daily.   atenolol  (TENORMIN ) 25 MG tablet Take 25 mg by mouth daily.   Calcium  Citrate 250 MG TABS Take 500 mg by mouth 3 (three) times daily.   carboxymethylcellulose (REFRESH PLUS) 0.5 % SOLN Place 1 drop into both eyes 3 (three) times daily as needed.   celecoxib  (CELEBREX ) 100 MG capsule Take 1 capsule (100 mg total) by mouth 2 (two) times daily.   Cholecalciferol  10 MCG (400 UNIT) CAPS Take 1 capsule by mouth daily.   denosumab  (PROLIA ) 60 MG/ML SOSY injection Inject 60 mg into the skin every 6 (six) months.   fluticasone  (FLONASE ) 50 MCG/ACT nasal spray Place 2 sprays into both nostrils daily as needed for allergies or rhinitis (left nostril).   LORazepam  (ATIVAN ) 0.5 MG tablet TAKE ONE TABLET BY MOUTH EVERY DAY AS NEEDED   losartan  (COZAAR ) 25 MG tablet Take 1 tablet (25 mg total) by mouth daily.   MAGNESIUM  PO Take by mouth.   Multiple Vitamin (MULTI-VITAMIN) tablet Take 1 tablet by mouth daily.   Multiple Vitamins-Minerals (ZINC PO) Take by mouth.   ondansetron  (ZOFRAN -ODT) 4 MG disintegrating tablet Take 1 tablet (4 mg total) by mouth every 8 (eight) hours as needed for nausea or vomiting.   No facility-administered medications prior to visit.    Review of Systems  Eyes:  Positive for visual disturbance (no vision out of right eye; reads 20/40 out of left eye but eye muscles fatigue when reading print).  Objective    BP (!) 172/66   Pulse 62   Ht 4' 10 (1.473 m)   Wt 107 lb 11.2 oz (48.9 kg)   SpO2 100%   BMI 22.51 kg/m     Physical Exam Vitals and nursing note reviewed.  Constitutional:      General: She is not in acute distress.    Appearance: Normal appearance.  HENT:     Head: Normocephalic and atraumatic.   Eyes:     General: No scleral icterus.    Conjunctiva/sclera: Conjunctivae normal.    Cardiovascular:     Rate and Rhythm: Normal rate.  Pulmonary:      Effort: Pulmonary effort is normal.   Musculoskeletal:     Comments: Uses rollator at baseline   Neurological:     Mental Status: She is alert and oriented to person, place, and time. Mental status is at baseline.   Psychiatric:        Mood and Affect: Mood normal.        Behavior: Behavior normal.      No results found for any visits on 11/03/23.  Assessment & Plan    Essential (primary) hypertension  Memory impairment  Macular degeneration, unspecified laterality, unspecified type  Hard of hearing  Seasonal allergic rhinitis due to pollen  Age-related osteoporosis without current pathological fracture      Hypertension Home blood pressure readings improving but occasionally elevated. Clinic readings higher, suggesting possible home cuff inaccuracies. - Recommend purchasing a new blood pressure cuff, preferably of Omron, Welch-Allen or similar reliable brand. - Bring new blood pressure cuff to next appointment for comparison with clinic readings. - Schedule follow-up appointment in three months to reassess blood pressure. - Bring blood pressure log to next appointment.  Memory impairment Concerns about memory and vocabulary decline attributed to age and macular degeneration. Declined neurology referral.  Macular Degeneration Macular degeneration affects reading ability, contributing to perceived memory decline. Relies on auditory methods for reading.  Hard of hearing Worsening hearing, exacerbated by allergies. Hearing aids less effective, worse on days with severe allergies.  Allergic Rhinitis Nasal congestion related to allergies, managed with nasal sprays and cetirizine. - Continue using Flonase  in left nostril and XSlear in right nostril as needed. - Continue cetirizine as needed, cutting tablets in half for dosing flexibility.  Osteoporosis Osteoporosis impacts energy levels and physical activity, particularly dancing. - Use Celebrex  as needed for pain  management.  Has not been needing it.    Return in about 3 months (around 02/03/2024) for HTN, Chronic f/u.      I discussed the assessment and treatment plan with the patient  The patient was provided an opportunity to ask questions and all were answered. The patient agreed with the plan and demonstrated an understanding of the instructions.   The patient was advised to call back or seek an in-person evaluation if the symptoms worsen or if the condition fails to improve as anticipated.    Carlean Charter, DO  [facility] [phone] (phone) [phone] (fax)  [facility]

## 2023-11-09 NOTE — Telephone Encounter (Signed)
 Opened in error

## 2023-11-14 DIAGNOSIS — H3530 Unspecified macular degeneration: Secondary | ICD-10-CM | POA: Insufficient documentation

## 2023-11-17 DIAGNOSIS — D2262 Melanocytic nevi of left upper limb, including shoulder: Secondary | ICD-10-CM | POA: Diagnosis not present

## 2023-11-17 DIAGNOSIS — D2272 Melanocytic nevi of left lower limb, including hip: Secondary | ICD-10-CM | POA: Diagnosis not present

## 2023-11-17 DIAGNOSIS — L821 Other seborrheic keratosis: Secondary | ICD-10-CM | POA: Diagnosis not present

## 2023-11-17 DIAGNOSIS — D2261 Melanocytic nevi of right upper limb, including shoulder: Secondary | ICD-10-CM | POA: Diagnosis not present

## 2023-11-17 DIAGNOSIS — D2271 Melanocytic nevi of right lower limb, including hip: Secondary | ICD-10-CM | POA: Diagnosis not present

## 2023-11-17 DIAGNOSIS — D225 Melanocytic nevi of trunk: Secondary | ICD-10-CM | POA: Diagnosis not present

## 2023-11-21 DIAGNOSIS — M79675 Pain in left toe(s): Secondary | ICD-10-CM | POA: Diagnosis not present

## 2023-11-21 DIAGNOSIS — B351 Tinea unguium: Secondary | ICD-10-CM | POA: Diagnosis not present

## 2023-11-21 DIAGNOSIS — M79674 Pain in right toe(s): Secondary | ICD-10-CM | POA: Diagnosis not present

## 2023-12-18 ENCOUNTER — Other Ambulatory Visit: Payer: Self-pay | Admitting: Family Medicine

## 2024-01-01 DIAGNOSIS — H02885 Meibomian gland dysfunction left lower eyelid: Secondary | ICD-10-CM | POA: Diagnosis not present

## 2024-01-01 DIAGNOSIS — H353113 Nonexudative age-related macular degeneration, right eye, advanced atrophic without subfoveal involvement: Secondary | ICD-10-CM | POA: Diagnosis not present

## 2024-01-01 DIAGNOSIS — H40051 Ocular hypertension, right eye: Secondary | ICD-10-CM | POA: Diagnosis not present

## 2024-01-01 DIAGNOSIS — H02882 Meibomian gland dysfunction right lower eyelid: Secondary | ICD-10-CM | POA: Diagnosis not present

## 2024-01-01 DIAGNOSIS — H353222 Exudative age-related macular degeneration, left eye, with inactive choroidal neovascularization: Secondary | ICD-10-CM | POA: Diagnosis not present

## 2024-01-01 DIAGNOSIS — Z961 Presence of intraocular lens: Secondary | ICD-10-CM | POA: Diagnosis not present

## 2024-01-01 DIAGNOSIS — H04123 Dry eye syndrome of bilateral lacrimal glands: Secondary | ICD-10-CM | POA: Diagnosis not present

## 2024-01-01 DIAGNOSIS — H2701 Aphakia, right eye: Secondary | ICD-10-CM | POA: Diagnosis not present

## 2024-01-01 DIAGNOSIS — G43109 Migraine with aura, not intractable, without status migrainosus: Secondary | ICD-10-CM | POA: Diagnosis not present

## 2024-01-05 DIAGNOSIS — H353221 Exudative age-related macular degeneration, left eye, with active choroidal neovascularization: Secondary | ICD-10-CM | POA: Diagnosis not present

## 2024-02-02 ENCOUNTER — Ambulatory Visit: Admitting: Family Medicine

## 2024-02-05 ENCOUNTER — Encounter: Payer: Self-pay | Admitting: Family Medicine

## 2024-02-05 ENCOUNTER — Ambulatory Visit: Admitting: Family Medicine

## 2024-02-05 VITALS — BP 185/82 | HR 64 | Ht <= 58 in | Wt 107.0 lb

## 2024-02-05 DIAGNOSIS — M2550 Pain in unspecified joint: Secondary | ICD-10-CM

## 2024-02-05 DIAGNOSIS — I10 Essential (primary) hypertension: Secondary | ICD-10-CM | POA: Diagnosis not present

## 2024-02-05 DIAGNOSIS — F330 Major depressive disorder, recurrent, mild: Secondary | ICD-10-CM

## 2024-02-05 DIAGNOSIS — M810 Age-related osteoporosis without current pathological fracture: Secondary | ICD-10-CM | POA: Diagnosis not present

## 2024-02-05 DIAGNOSIS — G8929 Other chronic pain: Secondary | ICD-10-CM | POA: Diagnosis not present

## 2024-02-05 DIAGNOSIS — R413 Other amnesia: Secondary | ICD-10-CM

## 2024-02-05 DIAGNOSIS — F411 Generalized anxiety disorder: Secondary | ICD-10-CM

## 2024-02-05 DIAGNOSIS — Z23 Encounter for immunization: Secondary | ICD-10-CM

## 2024-02-05 NOTE — Progress Notes (Signed)
 Established patient visit   Patient: Katie Hanna   DOB: 11-Aug-1929   89 y.o. Female  MRN: [id number] Visit Date: 02/05/2024  Today's healthcare provider: LAURAINE LOISE BUOY, DO   No chief complaint on file.  Subjective    HPI Katie Hanna is a 89 year old female with hypertension and osteoporosis who presents for follow-up of her blood pressure management and bone health.  She monitors her blood pressure at home, with readings generally around 134/69 mmHg, but experiences significantly higher readings in the office, such as 180/80 mmHg. She attributes this discrepancy to stress associated with going out, noting it takes her about three hours to prepare due to her slow pace to avoid accidents.  She has a history of osteoporosis and is concerned about her bone health, particularly after experiencing severe headaches and memory issues following a Prolia  injection. She is cautious about falling due to her decreasing bone density and uses a walker to prevent falls. She recalls a fall 20 years ago without injury and is worried about potential fractures.  She experiences neuropathy, particularly in her left leg, which causes burning sensations if she stands for long periods. This condition was diagnosed several years ago.  She has a history of emotional problems and has tried various therapies, including yoga, talking therapy, and hypnosis, but remains easily upset. She has not been willing to try medication for this due to negative experiences and concerns about side effects.  Her social history includes a challenging upbringing with a depressed mother and a critical family environment. 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       Medications: Outpatient Medications Prior to Visit  Medication Sig   ascorbic acid  (VITAMIN C) 1000 MG tablet Take 1,000 mg by mouth daily.   atenolol   (TENORMIN ) 25 MG tablet TAKE 1 TABLET BY MOUTH DAILY   Calcium  Citrate 250 MG TABS Take 500 mg by mouth 3 (three) times daily.   carboxymethylcellulose (REFRESH PLUS) 0.5 % SOLN Place 1 drop into both eyes 3 (three) times daily as needed.   celecoxib  (CELEBREX ) 100 MG capsule Take 1 capsule (100 mg total) by mouth 2 (two) times daily.   Cholecalciferol  10 MCG (400 UNIT) CAPS Take 1 capsule by mouth daily.   denosumab  (PROLIA ) 60 MG/ML SOSY injection Inject 60 mg into the skin every 6 (six) months.   fluticasone  (FLONASE ) 50 MCG/ACT nasal spray Place 2 sprays into both nostrils daily as needed for allergies or rhinitis (left nostril).   LORazepam  (ATIVAN ) 0.5 MG tablet TAKE 1 TABLET BY MOUTH ONCE DAILY AS NEEDED   losartan  (COZAAR ) 25 MG tablet Take 1 tablet (25 mg total) by mouth daily.   MAGNESIUM  PO Take by mouth.   Multiple Vitamin (MULTI-VITAMIN) tablet Take 1 tablet by mouth daily.   Multiple Vitamins-Minerals (ZINC PO) Take by mouth.   [DISCONTINUED] ondansetron  (ZOFRAN -ODT) 4 MG disintegrating tablet Take 1 tablet (4 mg total) by mouth every 8 (eight) hours as needed for nausea or vomiting.   No facility-administered medications prior to visit.        Objective    BP (!) 185/82 (BP Location: Right Arm, Patient Position: Sitting, Cuff Size: Normal)   Pulse 64   Ht 4' 10 (1.473 m)   Wt 107 lb (48.5 kg)   SpO2 100%   BMI 22.36 kg/m  Physical Exam Constitutional:      Appearance: Normal appearance.  HENT:     Head: Normocephalic and atraumatic.  Eyes:     General: No scleral icterus.    Extraocular Movements: Extraocular movements intact.     Conjunctiva/sclera: Conjunctivae normal.  Cardiovascular:     Rate and Rhythm: Normal rate and regular rhythm.     Pulses: Normal pulses.     Heart sounds: Normal heart sounds.  Pulmonary:     Effort: Pulmonary effort is normal. No respiratory distress.     Breath sounds: Normal breath sounds.  Musculoskeletal:     Right  lower leg: No edema.     Left lower leg: No edema.  Skin:    General: Skin is warm and dry.  Neurological:     Mental Status: She is alert and oriented to person, place, and time. Mental status is at baseline.  Psychiatric:        Mood and Affect: Mood normal.        Behavior: Behavior normal.      No results found for any visits on 02/05/24.  Assessment & Plan    Essential (primary) hypertension  Age-related osteoporosis without current pathological fracture  Chronic pain of multiple joints  Mild episode of recurrent major depressive disorder (HCC)  Generalized anxiety disorder  Memory impairment  Needs flu shot -     Flu vaccine HIGH DOSE PF(Fluzone Trivalent)      Essential hypertension Chronic, stable.  Blood pressure elevated in clinic but controlled at home, indicating possible white coat hypertension.  Given patient's advanced age and severe osteoporosis with multiple fragility fractures, the benefit of achieving tighter blood pressure control does not exceed the risk of causing dizziness and potential falls. - Patient to bring blood pressure cuff to next visit for validation of readings. - Continue losartan  25 mg daily - Continue atenolol  25 mg daily   Osteoporosis, age-related, without current pathological fracture Declining bone density with previous adverse reaction possibly related to to Prolia . Discussed hip fracture risk and treatment benefits. - Consult endocrinologist about osteoporosis treatment options, including resuming Prolia  or alternatives like Reclast .  Chronic pain of multiple joints Chronic pain in wrists, ankles, and feet.  Continue celecoxib  as needed  Memory impairment Worsening memory possibly related to previous adverse reaction to Prolia .  Mild episode of recurrent major depressive disorder; generalized anxiety disorder Long-standing emotional distress with history of anxiety and depression. Declined medication and previous therapies  ineffective. - Consider discussing emotional distress with a counselor or therapist.  Patient declines for the time being    Return in about 3 months (around 05/06/2024) for Chronic f/u.      I discussed the assessment and treatment plan with the patient  The patient was provided an opportunity to ask questions and all were answered. The patient agreed with the plan and demonstrated an understanding of the instructions.   The patient was advised to call back or seek an in-person evaluation if the symptoms worsen or if the condition fails to improve as anticipated.    LAURAINE LOISE BUOY, DO  [facility] [phone] (phone) [phone] (fax)  [facility]

## 2024-02-13 DIAGNOSIS — M810 Age-related osteoporosis without current pathological fracture: Secondary | ICD-10-CM | POA: Diagnosis not present

## 2024-02-13 DIAGNOSIS — E049 Nontoxic goiter, unspecified: Secondary | ICD-10-CM | POA: Diagnosis not present

## 2024-02-19 DIAGNOSIS — G8929 Other chronic pain: Secondary | ICD-10-CM | POA: Insufficient documentation

## 2024-02-20 ENCOUNTER — Other Ambulatory Visit: Payer: Self-pay | Admitting: Family Medicine

## 2024-02-20 DIAGNOSIS — I10 Essential (primary) hypertension: Secondary | ICD-10-CM

## 2024-02-27 DIAGNOSIS — B351 Tinea unguium: Secondary | ICD-10-CM | POA: Diagnosis not present

## 2024-02-27 DIAGNOSIS — M810 Age-related osteoporosis without current pathological fracture: Secondary | ICD-10-CM | POA: Diagnosis not present

## 2024-02-27 DIAGNOSIS — M79674 Pain in right toe(s): Secondary | ICD-10-CM | POA: Diagnosis not present

## 2024-02-27 DIAGNOSIS — M79675 Pain in left toe(s): Secondary | ICD-10-CM | POA: Diagnosis not present

## 2024-02-28 DIAGNOSIS — M810 Age-related osteoporosis without current pathological fracture: Secondary | ICD-10-CM | POA: Diagnosis not present

## 2024-03-14 DIAGNOSIS — H6122 Impacted cerumen, left ear: Secondary | ICD-10-CM | POA: Diagnosis not present

## 2024-03-14 DIAGNOSIS — R040 Epistaxis: Secondary | ICD-10-CM | POA: Diagnosis not present

## 2024-03-14 DIAGNOSIS — H6062 Unspecified chronic otitis externa, left ear: Secondary | ICD-10-CM | POA: Diagnosis not present

## 2024-03-14 DIAGNOSIS — H903 Sensorineural hearing loss, bilateral: Secondary | ICD-10-CM | POA: Diagnosis not present

## 2024-04-05 DIAGNOSIS — H353221 Exudative age-related macular degeneration, left eye, with active choroidal neovascularization: Secondary | ICD-10-CM | POA: Diagnosis not present

## 2024-04-19 DIAGNOSIS — H2701 Aphakia, right eye: Secondary | ICD-10-CM | POA: Diagnosis not present

## 2024-04-19 DIAGNOSIS — H52223 Regular astigmatism, bilateral: Secondary | ICD-10-CM | POA: Diagnosis not present

## 2024-04-19 DIAGNOSIS — H02885 Meibomian gland dysfunction left lower eyelid: Secondary | ICD-10-CM | POA: Diagnosis not present

## 2024-04-19 DIAGNOSIS — H353222 Exudative age-related macular degeneration, left eye, with inactive choroidal neovascularization: Secondary | ICD-10-CM | POA: Diagnosis not present

## 2024-04-19 DIAGNOSIS — H40051 Ocular hypertension, right eye: Secondary | ICD-10-CM | POA: Diagnosis not present

## 2024-04-19 DIAGNOSIS — H524 Presbyopia: Secondary | ICD-10-CM | POA: Diagnosis not present

## 2024-04-19 DIAGNOSIS — Z961 Presence of intraocular lens: Secondary | ICD-10-CM | POA: Diagnosis not present

## 2024-04-19 DIAGNOSIS — H02882 Meibomian gland dysfunction right lower eyelid: Secondary | ICD-10-CM | POA: Diagnosis not present

## 2024-04-19 DIAGNOSIS — H353113 Nonexudative age-related macular degeneration, right eye, advanced atrophic without subfoveal involvement: Secondary | ICD-10-CM | POA: Diagnosis not present

## 2024-04-19 DIAGNOSIS — H5213 Myopia, bilateral: Secondary | ICD-10-CM | POA: Diagnosis not present

## 2024-04-19 DIAGNOSIS — H04123 Dry eye syndrome of bilateral lacrimal glands: Secondary | ICD-10-CM | POA: Diagnosis not present

## 2024-04-19 DIAGNOSIS — G43109 Migraine with aura, not intractable, without status migrainosus: Secondary | ICD-10-CM | POA: Diagnosis not present

## 2024-05-06 ENCOUNTER — Ambulatory Visit: Admitting: Family Medicine

## 2024-05-17 ENCOUNTER — Ambulatory Visit: Admitting: Family Medicine

## 2024-05-17 ENCOUNTER — Encounter: Payer: Self-pay | Admitting: Family Medicine

## 2024-05-17 VITALS — BP 172/71 | HR 65 | Temp 97.5°F | Ht <= 58 in | Wt 111.0 lb

## 2024-05-17 DIAGNOSIS — J309 Allergic rhinitis, unspecified: Secondary | ICD-10-CM

## 2024-05-17 DIAGNOSIS — Z9181 History of falling: Secondary | ICD-10-CM | POA: Diagnosis not present

## 2024-05-17 DIAGNOSIS — I10 Essential (primary) hypertension: Secondary | ICD-10-CM

## 2024-05-17 DIAGNOSIS — H9190 Unspecified hearing loss, unspecified ear: Secondary | ICD-10-CM

## 2024-05-17 NOTE — Assessment & Plan Note (Addendum)
 Hypertension, improved with losartan . Blood pressures mostly in the 120s-130s, occasionally reaching 160s. No dizziness or lightheadedness reported. Patient feels more energetic and is exercising more. Discussed risks and benefits of continuing losartan . Patient prefers to continue current medication regimen.  No additional antihypertensive medication initiated due to normal home readings and dizziness risk, particularly given advanced osteoporosis. - Continue home blood pressure monitoring. - Bring home blood pressure cuff to next appointment for comparison. - Continue losartan  25 mg daily - Continue atenolol  25 mg daily

## 2024-05-17 NOTE — Patient Instructions (Signed)
 Recommended vaccines: Tdap (tetanus, diphtheria and pertussis) and RSV (respiratory syncytial virus).

## 2024-05-17 NOTE — Progress Notes (Signed)
 "     Established patient visit   Patient: Katie Hanna   DOB: [birth year]   89 y.o. Female  MRN: [id number] Visit Date: 05/17/2024  Today's healthcare provider: LAURAINE LOISE BUOY, DO   Chief Complaint  Patient presents with   Medical Management of Chronic Issues    Patient is here for a 3 month follow up.  Expresses no concerns.   Subjective    HPI Katie Hanna is a 89 year old female who presents with follow-up of hypertension and recent fall.  She experienced a fall on October 11th after taking a half dose of an allergy medication and a half dose of another medication, which caused significant drowsiness. She fell while standing beside her bed, impacting her face, elbows, and knees, but did not sustain any fractures. Since the fall, she has experienced a loss of confidence in her mobility.  She monitors her blood pressure at home using her own cuff and her husband's, both of which she considers reliable. Her home readings are generally normal, with the most recent being 141/73 mmHg, although she notes higher readings when taken at the clinic.  She experiences anxiety in crowds due to hearing and vision difficulties, which she manages by avoiding such situations. She uses hearing aids and is considering switching to rechargeable ones due to difficulty handling small batteries.  She has a history of allergies, leading to frequent sneezing and nasal congestion. She takes a generic form of Zyrtec nightly, which effectively manages these symptoms. She also experiences occasional right-sided epistaxis, especially after repeated nose blowing.  She has a history of a goiter, and recalls that doctors told her 20 years ago she was not a candidate for surgery due to its location. She reports occasional choking and sneezing episodes related to this.  She lives with her 79 year old husband, who assists with grocery shopping.  They are seeking help with house cleaning due to increasing difficulty  managing on their own.      Medications: Show/hide medication list[1]  Review of Systems  Constitutional:  Negative for appetite change, chills, fatigue and fever.  HENT:  Positive for hearing loss (hearing aids at baseline. Difficulty with current aids; will be obtaining new hearing aids next year).   Respiratory:  Negative for chest tightness and shortness of breath.   Cardiovascular:  Negative for chest pain and palpitations.  Gastrointestinal:  Negative for abdominal pain, nausea and vomiting.  Neurological:  Positive for weakness (at baseline; relies on rollator). Negative for dizziness and headaches.  Psychiatric/Behavioral:  The patient is nervous/anxious.         Objective    BP (!) 172/71 (BP Location: Left Arm, Patient Position: Sitting, Cuff Size: Normal)   Pulse 65   Temp (!) 97.5 F (36.4 C) (Oral)   Ht 4' 10 (1.473 m)   Wt 111 lb (50.3 kg)   SpO2 100%   BMI 23.20 kg/m     Physical Exam Constitutional:      Appearance: Normal appearance.  HENT:     Head: Normocephalic and atraumatic.  Eyes:     General: No scleral icterus.    Extraocular Movements: Extraocular movements intact.     Conjunctiva/sclera: Conjunctivae normal.  Cardiovascular:     Rate and Rhythm: Normal rate and regular rhythm.     Pulses: Normal pulses.     Heart sounds: Normal heart sounds.  Pulmonary:     Effort: Pulmonary effort is normal. No respiratory distress.     Breath  sounds: Normal breath sounds.  Abdominal:     General: Bowel sounds are normal. There is no distension.     Palpations: Abdomen is soft. There is no mass.     Tenderness: There is no abdominal tenderness. There is no guarding.  Musculoskeletal:        General: Deformity (+kyphosis) present.     Right lower leg: No edema.     Left lower leg: No edema.  Skin:    General: Skin is warm and dry.  Neurological:     Mental Status: She is alert and oriented to person, place, and time. Mental status is at  baseline.  Psychiatric:        Mood and Affect: Mood normal.        Behavior: Behavior normal.      No results found for any visits on 05/17/24.  Assessment & Plan    Essential (primary) hypertension Assessment & Plan: Hypertension, improved with losartan . Blood pressures mostly in the 120s-130s, occasionally reaching 160s. No dizziness or lightheadedness reported. Patient feels more energetic and is exercising more. Discussed risks and benefits of continuing losartan . Patient prefers to continue current medication regimen.  No additional antihypertensive medication initiated due to normal home readings and dizziness risk, particularly given advanced osteoporosis. - Continue home blood pressure monitoring. - Bring home blood pressure cuff to next appointment for comparison. - Continue losartan  25 mg daily - Continue atenolol  25 mg daily   Allergic rhinitis, unspecified seasonality, unspecified trigger  Hard of hearing  History of fall     Allergic rhinitis, unspecified seasonality, unspecified trigger  Nasal congestion and sneezing due to environmental allergens. Generic Zyrtec provides relief. History of epistaxis from nasal trauma, treated with cauterization. - Continue using generic Zyrtec as needed.  Hard of hearing Difficulty with high-pitched voices, affecting communication. Plans to upgrade to rechargeable hearing aids. - Proceed with obtaining new rechargeable hearing aids after Christmas.  History of fall Recent fall on October 11th, likely due to medication interaction. No injuries, but emotional impact noted. Concerns about dizziness and fear of falling persist. No current dizziness or lightheadedness. - Avoid combining medications used prior to that episode. - Monitor for dizziness or lightheadedness.  General health maintenance Plans to receive TDAP and RSV vaccinations. No recent vaccinations due to personal circumstances. - Receive TDAP and RSV vaccinations  at preferred pharmacy.    Return in about 3 months (around 08/15/2024) for chronic f/u, and in 6 monthsTOC w/Dr. Franchot.      I discussed the assessment and treatment plan with the patient  The patient was provided an opportunity to ask questions and all were answered. The patient agreed with the plan and demonstrated an understanding of the instructions.   The patient was advised to call back or seek an in-person evaluation if the symptoms worsen or if the condition fails to improve as anticipated.    LAURAINE LOISE BUOY, DO  [facility] [phone] (phone) [phone] (fax)  [facility]     [1]  Outpatient Medications Prior to Visit  Medication Sig   ascorbic acid  (VITAMIN C) 1000 MG tablet Take 1,000 mg by mouth daily.   atenolol  (TENORMIN ) 25 MG tablet TAKE 1 TABLET BY MOUTH DAILY   Calcium  Citrate 250 MG TABS Take 500 mg by mouth 3 (three) times daily.   carboxymethylcellulose (REFRESH PLUS) 0.5 % SOLN Place 1 drop into both eyes 3 (three) times daily as needed.   celecoxib  (CELEBREX ) 100 MG  capsule Take 1 capsule (100 mg total) by mouth 2 (two) times daily.   Cholecalciferol  10 MCG (400 UNIT) CAPS Take 1 capsule by mouth daily.   denosumab  (PROLIA ) 60 MG/ML SOSY injection Inject 60 mg into the skin every 6 (six) months.   fluticasone  (FLONASE ) 50 MCG/ACT nasal spray Place 2 sprays into both nostrils daily as needed for allergies or rhinitis (left nostril).   LORazepam  (ATIVAN ) 0.5 MG tablet TAKE 1 TABLET BY MOUTH ONCE DAILY AS NEEDED   losartan  (COZAAR ) 25 MG tablet TAKE 1 TABLET BY MOUTH DAILY   MAGNESIUM  PO Take by mouth.   Multiple Vitamin (MULTI-VITAMIN) tablet Take 1 tablet by mouth daily.   Multiple Vitamins-Minerals (ZINC PO) Take by mouth.   No facility-administered medications prior to visit.   "

## 2024-05-24 ENCOUNTER — Other Ambulatory Visit: Payer: Self-pay | Admitting: Family Medicine

## 2024-05-24 DIAGNOSIS — I10 Essential (primary) hypertension: Secondary | ICD-10-CM

## 2024-06-11 ENCOUNTER — Other Ambulatory Visit: Payer: Self-pay | Admitting: Otolaryngology

## 2024-06-11 DIAGNOSIS — E049 Nontoxic goiter, unspecified: Secondary | ICD-10-CM

## 2024-06-14 ENCOUNTER — Ambulatory Visit
Admission: RE | Admit: 2024-06-14 | Discharge: 2024-06-14 | Disposition: A | Discharge status: Home / Self Care | Source: Ambulatory Visit | Attending: Otolaryngology | Admitting: Otolaryngology

## 2024-06-14 DIAGNOSIS — E049 Nontoxic goiter, unspecified: Secondary | ICD-10-CM | POA: Diagnosis present

## 2024-08-15 ENCOUNTER — Ambulatory Visit: Admitting: Family Medicine

## 2024-08-21 ENCOUNTER — Ambulatory Visit

## 2024-08-27 ENCOUNTER — Ambulatory Visit

## 2024-11-19 ENCOUNTER — Encounter
